# Patient Record
Sex: Male | Born: 1985
Health system: Southern US, Community
[De-identification: ages and names within clinical notes are randomized; demographics above are authoritative.]

## PROBLEM LIST (undated history)

## (undated) DIAGNOSIS — J45909 Unspecified asthma, uncomplicated: Secondary | ICD-10-CM

## (undated) DIAGNOSIS — K219 Gastro-esophageal reflux disease without esophagitis: Secondary | ICD-10-CM

## (undated) HISTORY — PX: NO PAST SURGERIES: SHX2092

---

## 2009-01-23 ENCOUNTER — Emergency Department: Payer: Self-pay | Admitting: Emergency Medicine

## 2009-08-24 ENCOUNTER — Emergency Department: Payer: Self-pay | Admitting: Emergency Medicine

## 2016-04-05 ENCOUNTER — Ambulatory Visit
Admission: EM | Admit: 2016-04-05 | Discharge: 2016-04-05 | Disposition: A | Discharge status: Home / Self Care | Payer: Self-pay | Attending: Family Medicine | Admitting: Family Medicine

## 2016-04-05 ENCOUNTER — Encounter: Payer: Self-pay | Admitting: Emergency Medicine

## 2016-04-05 DIAGNOSIS — J069 Acute upper respiratory infection, unspecified: Secondary | ICD-10-CM

## 2016-04-05 DIAGNOSIS — J209 Acute bronchitis, unspecified: Secondary | ICD-10-CM

## 2016-04-05 HISTORY — DX: Unspecified asthma, uncomplicated: J45.909

## 2016-04-05 LAB — RAPID INFLUENZA A&B ANTIGENS: Influenza B (ARMC): NEGATIVE

## 2016-04-05 LAB — RAPID STREP SCREEN (MED CTR MEBANE ONLY): STREPTOCOCCUS, GROUP A SCREEN (DIRECT): NEGATIVE

## 2016-04-05 LAB — RAPID INFLUENZA A&B ANTIGENS (ARMC ONLY): INFLUENZA A (ARMC): NEGATIVE

## 2016-04-05 MED ORDER — ACETAMINOPHEN 500 MG PO TABS
1000.0000 mg | ORAL_TABLET | Freq: Once | ORAL | Status: AC
  Administered 2016-04-05: 1000 mg via ORAL

## 2016-04-05 MED ORDER — IPRATROPIUM-ALBUTEROL 0.5-2.5 (3) MG/3ML IN SOLN
3.0000 mL | Freq: Once | RESPIRATORY_TRACT | Status: AC
  Administered 2016-04-05: 3 mL via RESPIRATORY_TRACT

## 2016-04-05 MED ORDER — ALBUTEROL SULFATE HFA 108 (90 BASE) MCG/ACT IN AERS: 2.0000 | INHALATION_SPRAY | RESPIRATORY_TRACT | 0 refills | Status: DC | PRN

## 2016-04-05 MED ORDER — METHYLPREDNISOLONE SODIUM SUCC 125 MG IJ SOLR
125.0000 mg | Freq: Once | INTRAMUSCULAR | Status: AC
  Administered 2016-04-05: 125 mg via INTRAMUSCULAR

## 2016-04-05 MED ORDER — PREDNISONE 10 MG (21) PO TBPK: ORAL_TABLET | ORAL | 0 refills | Status: DC

## 2016-04-05 MED ORDER — AZITHROMYCIN 250 MG PO TABS: ORAL_TABLET | ORAL | 0 refills | Status: DC

## 2016-04-05 NOTE — ED Provider Notes (Addendum)
[facility]    CSN: [id number] Arrival date & time: 04/05/16  1618  First Provider Contact:  First MD Initiated Contact with Patient 04/05/16 1838        History   Chief Complaint Chief Complaint  Patient presents with  . Cough    HPI Richard Greer is a 30 y.o. male.   Patient reports he works at the [employer] and yesterday before he left he started getting short of breath. 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 He has proximal spasm weakness general malaise and not feeling well either. He gets almost yearly allergies but nothing this bad of this os severe before the past. He does have a history of asthma and he has an albuterol inhaler at home this has expired. Nose is nebulizer machine at home as well. He states that he doesn't have any drug allergies habits unfortunately they proceeded want any stop baking [place] today because of shortness of breath. No pertinent for previous surgical history no pertinent family medical history relevant to today's visit   The history is provided by the patient. No language interpreter was used.  Cough  Cough characteristics:  Non-productive Severity:  Severe Onset quality:  Sudden Duration:  2 days Timing:  Constant Progression:  Worsening Chronicity:  New Smoker: yes   Context: exposure to allergens, smoke exposure and upper respiratory infection   Context: not animal exposure, not fumes, not occupational exposure, not sick contacts, not weather changes and not with activity   Relieved by:  Nothing Worsened by:  Nothing Ineffective treatments:  None tried Associated symptoms: diaphoresis, headaches, myalgias, rhinorrhea, shortness of breath, sinus congestion, sore throat and wheezing   Associated symptoms: no chest pain, no chills, no ear fullness, no ear pain, no eye discharge, no fever and no rash     Past Medical History:  Diagnosis Date  . Asthma     There are no active  problems to display for this patient.   History reviewed. No pertinent surgical history.     Home Medications    Prior to Admission medications   Medication Sig Start Date End Date Taking? Authorizing Provider  albuterol (PROVENTIL HFA;VENTOLIN HFA) 108 (90 Base) MCG/ACT inhaler Inhale 2 puffs into the lungs every 4 (four) hours as needed for wheezing or shortness of breath.   Yes Historical Provider, MD  albuterol (PROVENTIL HFA;VENTOLIN HFA) 108 (90 Base) MCG/ACT inhaler Inhale 2 puffs into the lungs every 4 (four) hours as needed for wheezing or shortness of breath. 04/05/16   Hassan Rowan, MD  predniSONE (STERAPRED UNI-PAK 21 TAB) 10 MG (21) TBPK tablet Sig 6 tablet day 1, 5 tablets day 2, 4 tablets day 3,,3tablets day 4, 2 tablets day 5, 1 tablet day 6 take all tablets orally 04/05/16   Hassan Rowan, MD    Family History History reviewed. No pertinent family history.  Social History Social History  Substance Use Topics  . Smoking status: Current Every Day Smoker  . Smokeless tobacco: Never Used  . Alcohol use Yes     Allergies   Review of patient's allergies indicates no known allergies.   Review of Systems Review of Systems  Constitutional: Positive for diaphoresis. Negative for chills and fever.  HENT: Positive for rhinorrhea and sore throat. Negative for ear pain.   Eyes: Negative for discharge.  Respiratory: Positive for cough, shortness of breath and wheezing.   Cardiovascular: Negative for chest pain.  Musculoskeletal: Positive for  myalgias.  Skin: Negative for rash.  Neurological: Positive for headaches.     Physical Exam Triage Vital Signs ED Triage Vitals  Enc Vitals Group     BP 04/05/16 1819 128/68     Pulse Rate 04/05/16 1819 97     Resp 04/05/16 1819 17     Temp 04/05/16 1819 (!) 101 F (38.3 C)     Temp Source 04/05/16 1819 Tympanic     SpO2 04/05/16 1819 99 %     Weight 04/05/16 1819 265 lb (120.2 kg)     Height 04/05/16 1819 5\' 10"  (1.778 m)       Head Circumference --      Peak Flow --      Pain Score 04/05/16 1822 5     Pain Loc --      Pain Edu? --      Excl. in GC? --    No data found.   Updated Vital Signs BP 128/68 (BP Location: Right Arm)   Pulse 97   Temp (!) 101 F (38.3 C) (Tympanic)   Resp 17   Ht 5\' 10"  (1.778 m)   Wt 265 lb (120.2 kg)   SpO2 99%   BMI 38.02 kg/m   Visual Acuity Right Eye Distance:   Left Eye Distance:   Bilateral Distance:    Right Eye Near:   Left Eye Near:    Bilateral Near:     Physical Exam  Constitutional: He is oriented to person, place, and time. He appears well-developed and well-nourished.  HENT:  Head: Normocephalic and atraumatic.  Right Ear: Hearing, tympanic membrane, external ear and ear canal normal.  Left Ear: Hearing, tympanic membrane, external ear and ear canal normal.  Nose: Mucosal edema present. Right sinus exhibits no maxillary sinus tenderness. Left sinus exhibits no maxillary sinus tenderness.  Mouth/Throat: Posterior oropharyngeal erythema present.  Eyes: Conjunctivae and EOM are normal. Pupils are equal, round, and reactive to light.  Neck: Normal range of motion. Neck supple.  Cardiovascular: Normal rate and regular rhythm.   Pulmonary/Chest: He has decreased breath sounds. He has wheezes.  Musculoskeletal: Normal range of motion.  Neurological: He is alert and oriented to person, place, and time.  Skin: Skin is warm.  Psychiatric: He has a normal mood and affect.  Vitals reviewed.    UC Treatments / Results  Labs (all labs ordered are listed, but only abnormal results are displayed) Labs Reviewed  RAPID STREP SCREEN (NOT AT [facility])  RAPID INFLUENZA A&B ANTIGENS ([facility] ONLY)  CULTURE, GROUP A STREP [facility])    EKG  EKG Interpretation None       Radiology No results found.  Procedures Procedures (including critical care time)  Medications Ordered in UC Medications  ipratropium-albuterol (DUONEB) 0.5-2.5 (3) MG/3ML nebulizer  solution 3 mL (not administered)  methylPREDNISolone sodium succinate (SOLU-MEDROL) 125 mg/2 mL injection 125 mg (not administered)  acetaminophen (TYLENOL) tablet 1,000 mg (1,000 mg Oral Given 04/05/16 1827)     Initial Impression / Assessment and Plan / UC Course  I have reviewed the triage vital signs and the nursing notes.  Pertinent labs & imaging results that were available during my care of the patient were reviewed by me and considered in my medical decision making (see chart for details). Results for orders placed or performed during the hospital encounter of 04/05/16  Rapid strep screen  Result Value Ref Range   Streptococcus, Group A Screen (Direct) NEGATIVE NEGATIVE  Rapid Influenza A&B Antigens ([facility] only)  Result Value Ref Range   Influenza A ([facility]) NEGATIVE NEGATIVE   Influenza B ([facility]) NEGATIVE NEGATIVE   Clinical Course   Patient will be given DuoNeb treatment and Solu-Medrol 125 mg IM We'll place him on oral prednisone and work note for today and tomorrow we His albuterol inhaler Patient's appears to be more allergy related than anything else. Strep test flu test were negative and if the strep culture comes back positive we'll place on antibiotics  Final Clinical Impressions(s) / UC Diagnoses   Final diagnoses:  URI (upper respiratory infection)  Acute bronchitis with bronchospasm    New Prescriptions New Prescriptions   ALBUTEROL (PROVENTIL HFA;VENTOLIN HFA) 108 (90 BASE) MCG/ACT INHALER    Inhale 2 puffs into the lungs every 4 (four) hours as needed for wheezing or shortness of breath.   PREDNISONE (STERAPRED UNI-PAK 21 TAB) 10 MG (21) TBPK TABLET    Sig 6 tablet day 1, 5 tablets day 2, 4 tablets day 3,,3tablets day 4, 2 tablets day 5, 1 tablet day 6 take all tablets orally    In reviewing patient's chart his temperature greater 101 with that in mind going to go ahead and place on Z-Pak without having a positive strep culture.   Hassan Rowan, MD 04/05/16  Serena Croissant    Hassan Rowan, MD 04/05/16 [phone]

## 2016-04-05 NOTE — ED Triage Notes (Signed)
Patient c/o sore throat, cough, chest congestion, SOB, and runny nose that started yesterday.

## 2016-04-08 LAB — CULTURE, GROUP A STREP (THRC)

## 2016-04-11 ENCOUNTER — Telehealth: Payer: Self-pay | Admitting: *Deleted

## 2016-04-11 NOTE — Telephone Encounter (Signed)
Patient returned phone call. Informed patient that his strep culture resulted negative. Patient confirmed understanding of test results.

## 2016-09-08 ENCOUNTER — Emergency Department: Payer: BLUE CROSS/BLUE SHIELD

## 2016-09-08 ENCOUNTER — Emergency Department
Admission: EM | Admit: 2016-09-08 | Discharge: 2016-09-08 | Disposition: A | Discharge status: Home / Self Care | Payer: BLUE CROSS/BLUE SHIELD | Attending: Emergency Medicine | Admitting: Emergency Medicine

## 2016-09-08 ENCOUNTER — Encounter: Payer: Self-pay | Admitting: Emergency Medicine

## 2016-09-08 DIAGNOSIS — Z23 Encounter for immunization: Secondary | ICD-10-CM | POA: Insufficient documentation

## 2016-09-08 DIAGNOSIS — J45909 Unspecified asthma, uncomplicated: Secondary | ICD-10-CM | POA: Insufficient documentation

## 2016-09-08 DIAGNOSIS — Y929 Unspecified place or not applicable: Secondary | ICD-10-CM | POA: Insufficient documentation

## 2016-09-08 DIAGNOSIS — Y9389 Activity, other specified: Secondary | ICD-10-CM | POA: Insufficient documentation

## 2016-09-08 DIAGNOSIS — Y999 Unspecified external cause status: Secondary | ICD-10-CM | POA: Insufficient documentation

## 2016-09-08 DIAGNOSIS — W540XXA Bitten by dog, initial encounter: Secondary | ICD-10-CM | POA: Diagnosis not present

## 2016-09-08 DIAGNOSIS — Z79899 Other long term (current) drug therapy: Secondary | ICD-10-CM | POA: Diagnosis not present

## 2016-09-08 DIAGNOSIS — S61431A Puncture wound without foreign body of right hand, initial encounter: Secondary | ICD-10-CM

## 2016-09-08 DIAGNOSIS — S61412A Laceration without foreign body of left hand, initial encounter: Secondary | ICD-10-CM

## 2016-09-08 DIAGNOSIS — F17200 Nicotine dependence, unspecified, uncomplicated: Secondary | ICD-10-CM | POA: Insufficient documentation

## 2016-09-08 DIAGNOSIS — S61459A Open bite of unspecified hand, initial encounter: Secondary | ICD-10-CM

## 2016-09-08 DIAGNOSIS — S61411A Laceration without foreign body of right hand, initial encounter: Secondary | ICD-10-CM | POA: Diagnosis present

## 2016-09-08 MED ORDER — AMOXICILLIN-POT CLAVULANATE 875-125 MG PO TABS: 1.0000 | ORAL_TABLET | Freq: Two times a day (BID) | ORAL | 0 refills | Status: AC

## 2016-09-08 MED ORDER — AMOXICILLIN-POT CLAVULANATE 875-125 MG PO TABS
1.0000 | ORAL_TABLET | Freq: Once | ORAL | Status: AC
  Administered 2016-09-08: 1 via ORAL
  Filled 2016-09-08: qty 1

## 2016-09-08 MED ORDER — TETANUS-DIPHTH-ACELL PERTUSSIS 5-2.5-18.5 LF-MCG/0.5 IM SUSP
0.5000 mL | Freq: Once | INTRAMUSCULAR | Status: AC
  Administered 2016-09-08: 0.5 mL via INTRAMUSCULAR
  Filled 2016-09-08: qty 0.5

## 2016-09-08 NOTE — ED Notes (Signed)
Pt discharged to home.  Discharge instructions reviewed.  Verbalized understanding.  No questions or concerns at this time.  Teach back verified.  Pt in NAD.  No items left in ED.   

## 2016-09-08 NOTE — Discharge Instructions (Signed)
As we discussed, your left palm laceration needs to stay open and heal because of it being caused by a dog bite.  Because you are having trouble extending your right index finger due to a laceration or puncture wound from glass, we strongly encourage you to call the office of Dr. Mathis BudHernandez-Soria as soon as the clinic opens this morning and see if Dr. Stephenie AcresSoria can see you today, or to schedule the next available appointment.    Return to the emergency department if you develop new or worsening symptoms that concern you.

## 2016-09-08 NOTE — ED Provider Notes (Signed)
[facility] Emergency Department Provider Note  ____________________________________________   First MD Initiated Contact with Patient 09/08/16 [phone]     (approximate)  I have reviewed the triage vital signs and the nursing notes.   HISTORY  Chief Complaint Hand Injury    HPI Richard Greer is a 31 y.o. male with no significant chronic medical issues who presents for evaluation of acute injuries to both of his hands.  He reports that he was playing with his dog outside and she caught him in the left palm with one of her teeth and caused a long laceration to the palm of his hand.  He was going back and side and somehow pushed on the glass of the door which broke and punctured him in the back of his right hand in 2 separate locations.  He has some mild to moderate pain in the right index finger and is having difficulty with full extension and pain with flexion.  There is some swelling at the site of the puncture.  Movement makes the symptoms worse and holding still makes it better.  The injuries occurred just prior to his arrival in the emergency department.  He does not know the date of his last tetanus vaccination.  He does state that all of his dog's vaccinations are up-to-date.   Past Medical History:  Diagnosis Date  . Asthma     There are no active problems to display for this patient.   History reviewed. No pertinent surgical history.  Prior to Admission medications   Medication Sig Start Date End Date Taking? Authorizing Provider  albuterol (PROVENTIL HFA;VENTOLIN HFA) 108 (90 Base) MCG/ACT inhaler Inhale 2 puffs into the lungs every 4 (four) hours as needed for wheezing or shortness of breath.    Historical Provider, MD  albuterol (PROVENTIL HFA;VENTOLIN HFA) 108 (90 Base) MCG/ACT inhaler Inhale 2 puffs into the lungs every 4 (four) hours as needed for wheezing or shortness of breath. 04/05/16   Hassan Rowan, MD  amoxicillin-clavulanate (AUGMENTIN)  875-125 MG tablet Take 1 tablet by mouth every 12 (twelve) hours. 09/08/16 09/18/16  Loleta Rose, MD  azithromycin (ZITHROMAX Z-PAK) 250 MG tablet Take 2 tablets first day and then 1 po a day for 4 days 04/05/16   Hassan Rowan, MD  predniSONE (STERAPRED UNI-PAK 21 TAB) 10 MG (21) TBPK tablet Sig 6 tablet day 1, 5 tablets day 2, 4 tablets day 3,,3tablets day 4, 2 tablets day 5, 1 tablet day 6 take all tablets orally 04/05/16   Hassan Rowan, MD    Allergies Patient has no known allergies.  No family history on file.  Social History Social History  Substance Use Topics  . Smoking status: Current Every Day Smoker  . Smokeless tobacco: Never Used  . Alcohol use Yes    Review of Systems Constitutional: No fever/chills Eyes: No visual changes. ENT: No sore throat. Cardiovascular: Denies chest pain. Respiratory: Denies shortness of breath. Gastrointestinal: No abdominal pain.  No nausea, no vomiting.  No diarrhea.  No constipation. Genitourinary: Negative for dysuria. Musculoskeletal: Laceration due to dog bite on the palm of his left hand.  Puncture wounds x 2 on the back of his right hand due to glass. Skin: Negative for rash. Neurological: Negative for headaches, focal weakness or numbness.  10-point ROS otherwise negative.  ____________________________________________   PHYSICAL EXAM:  VITAL SIGNS: ED Triage Vitals  Enc Vitals Group     BP 09/08/16 0100 (!) 155/96     Pulse  Rate 09/08/16 0100 (!) 105     Resp 09/08/16 0100 18     Temp 09/08/16 0100 98.1 F (36.7 C)     Temp Source 09/08/16 0100 Oral     SpO2 09/08/16 0100 96 %     Weight 09/08/16 0100 265 lb (120.2 kg)     Height 09/08/16 0100 5\' 10"  (1.778 m)     Head Circumference --      Peak Flow --      Pain Score 09/08/16 0103 5     Pain Loc --      Pain Edu? --      Excl. in GC? --     Constitutional: Alert and oriented. Well appearing and in no acute distress. Eyes: Conjunctivae are normal. PERRL. EOMI. Head:  Atraumatic. Nose: No congestion/rhinnorhea. Mouth/Throat: Mucous membranes are moist. Neck: No stridor.  No meningeal signs.   Cardiovascular: Normal rate, regular rhythm. Good peripheral circulation. Grossly normal heart sounds. Respiratory: Normal respiratory effort.  No retractions. Lungs CTAB. Gastrointestinal: Soft and nontender. No distention.  Musculoskeletal: The patient has a relatively superficial wound that is about 5 cm long and goes across the palm of his left hand.  The 2 ends of the wound are slightly deeper but they are not gaping.  There is no evidence of any foreign body and there is only minimal tenderness to palpation.  The patient had already irrigated the wound and I made him irrigate his hand again extensively with soap and water in the sink.  This wound was caused by the dog's teeth.  On his right hand he has what appears to be a relatively superficial puncture wound on the dorsal aspect of the hand several centimeters proximal to the index finger MCP.  There is a significant amount of swelling to the area and tenderness to palpation.  There is a more superficial secondary wound proximal to the middle finger MCP.  The patient has sensation to light touch although he does state that the index finger "tingles".  He is able to flex normally but has some trouble with full extension of the finger. Neurologic:  Normal speech and language. No gross focal neurologic deficits are appreciated.    ____________________________________________   LABS (all labs ordered are listed, but only abnormal results are displayed)  Labs Reviewed - No data to display ____________________________________________  EKG  None - EKG not ordered by ED physician ____________________________________________  RADIOLOGY   Dg Hand 2 View Right  Result Date: 09/08/2016 CLINICAL DATA:  31 year old male with right hand injury and pain. EXAM: RIGHT HAND - 2 VIEW COMPARISON:  None. FINDINGS: There is no  acute fracture or dislocation. The bones are well mineralized. No arthritic changes. The soft tissues appear unremarkable. No radiopaque foreign object noted. IMPRESSION: Negative. Electronically Signed   By: Elgie CollardArash  Radparvar M.D.   On: 09/08/2016 01:26    ____________________________________________   PROCEDURES  Procedure(s) performed:   Marland Kitchen.Marland Kitchen.Laceration Repair Date/Time: 09/08/2016 3:24 AM Performed by: Loleta RoseFORBACH, Arvid Marengo Authorized by: Loleta RoseFORBACH, Carlos Quackenbush   Consent:    Consent obtained:  Verbal   Consent given by:  Patient Anesthesia (see MAR for exact dosages):    Anesthesia method:  None Laceration details:    Location:  Hand   Hand location:  R hand, dorsum   Length (cm):  1 Repair type:    Repair type:  Simple Exploration:    Contaminated: no   Treatment:    Amount of cleaning:  Extensive   Irrigation solution:  Tap water   Visualized foreign bodies/material removed: no   Skin repair:    Repair method:  Tissue adhesive Post-procedure details:    Dressing:  Open (no dressing)   Patient tolerance of procedure:  Tolerated well, no immediate complications     Critical Care performed: No ____________________________________________   INITIAL IMPRESSION / ASSESSMENT AND PLAN / ED COURSE  Pertinent labs & imaging results that were available during my care of the patient were reviewed by me and considered in my medical decision making (see chart for details).  I am concerned about the possibility of an extensor injury; though the injury appears relatively superficial, I think it is possible that he may have sustained a puncture injury that caused ligamentous/tendon injury.  I placed him in an aluminum foam finger splint and wrapped his hand with an Ace wrap to provide gentle pressure to the hematoma.  I encouraged him to follow up in a few hours when the Ortho clinic opens to follow up ASAP with Dr. Stephenie Acres (hand specialist).  Leaving laceration on left palm (dog bite) to heal by  secondary intention.  Prescribed Augmentin.  Gave usual/customary return precautions.       ____________________________________________  FINAL CLINICAL IMPRESSION(S) / ED DIAGNOSES  Final diagnoses:  Laceration of skin of left palm, initial encounter  Puncture wound of right hand without foreign body, initial encounter  Dog bite of hand without complication, initial encounter     MEDICATIONS GIVEN DURING THIS VISIT:  Medications  Tdap (BOOSTRIX) injection 0.5 mL (0.5 mLs Intramuscular Given 09/08/16 0311)  amoxicillin-clavulanate (AUGMENTIN) 875-125 MG per tablet 1 tablet (1 tablet Oral Given 09/08/16 0311)     NEW OUTPATIENT MEDICATIONS STARTED DURING THIS VISIT:  New Prescriptions   AMOXICILLIN-CLAVULANATE (AUGMENTIN) 875-125 MG TABLET    Take 1 tablet by mouth every 12 (twelve) hours.    Modified Medications   No medications on file    Discontinued Medications   No medications on file     Note:  This document was prepared using Dragon voice recognition software and may include unintentional dictation errors.    Loleta Rose, MD 09/08/16 [phone]

## 2016-09-08 NOTE — ED Triage Notes (Signed)
Pt unable to move right 2nd finger

## 2016-09-08 NOTE — ED Triage Notes (Addendum)
Patient ambulatory to triage with steady gait, without difficulty or distress noted; pt reports hitting glass door while playing with dog; lac to palm of left hand which is from the dog and 2 punctures to top of right hand from glass; weakness to index finger but good sensation; no active bleeding; sites clensed with NS and gauze dressing applied

## 2016-09-08 NOTE — ED Notes (Signed)
L hand lac approx 4 cm long on palm of hand.

## 2016-09-14 ENCOUNTER — Encounter
Admission: RE | Admit: 2016-09-14 | Discharge: 2016-09-14 | Disposition: A | Discharge status: Home / Self Care | Payer: BLUE CROSS/BLUE SHIELD | Source: Ambulatory Visit | Attending: Orthopedic Surgery | Admitting: Orthopedic Surgery

## 2016-09-14 HISTORY — DX: Gastro-esophageal reflux disease without esophagitis: K21.9

## 2016-09-14 NOTE — Patient Instructions (Signed)
  Your procedure is scheduled on: 09-15-16 Report to Same Day Surgery 2nd floor medical mall [facility](Medical Mall Entrance-take elevator on left to 2nd floor.  Check in with surgery information desk.) To find out your arrival time please call [phone] between 1PM - 3PM on 09-14-16  Remember: Instructions that are not followed completely may result in serious medical risk, up to and including death, or upon the discretion of your surgeon and anesthesiologist your surgery may need to be rescheduled.    _x___ 1. Do not eat food or drink liquids after midnight. No gum chewing or hard candies.     __x__ 2. No Alcohol for 24 hours before or after surgery.   __x__3. No Smoking for 24 prior to surgery.   ____  4. Bring all medications with you on the day of surgery if instructed.    __x__ 5. Notify your doctor if there is any change in your medical condition     (cold, fever, infections).     Do not wear jewelry, make-up, hairpins, clips or nail polish.  Do not wear lotions, powders, or perfumes. You may wear deodorant.  Do not shave 48 hours prior to surgery. Men may shave face and neck.  Do not bring valuables to the hospital.    [facility] any belongings or valuables.               Contacts, dentures or bridgework may not be worn into surgery.  Leave your suitcase in the car. After surgery it may be brought to your room.  For patients admitted to the hospital, discharge time is determined by your treatment team.   Patients discharged the day of surgery will not be allowed to drive home.  You will need someone to drive you home and stay with you the night of your procedure.    Please read over the following fact sheets that you were given:   [facility] Preparing for Surgery and or MRSA Information   ____ Take these medicines the morning of surgery with A SIP OF WATER:    1. NONE  2.  3.  4.  5.  6.  ____Fleets enema or Magnesium Citrate as directed.   ____  Use CHG Soap or sage wipes as directed on instruction sheet   _X___ Use inhalers on the day of surgery and bring to hospital day of surgery-USE ALBUTEROL INHALER AT HOME AND BRING TO HOSPITAL  ____ Stop metformin 2 days prior to surgery    ____ Take 1/2 of usual insulin dose the night before surgery and none on the morning of           surgery.   ____ Stop Aspirin, Coumadin, Pllavix ,Eliquis, Effient, or Pradaxa  x__ Stop Anti-inflammatories such as Advil, Aleve, Ibuprofen, Motrin, Naproxen,          Naprosyn, Goodies powders or aspirin products. Ok to take Tylenol.   ____ Stop supplements until after surgery.    ____ Bring C-Pap to the hospital.

## 2016-09-15 ENCOUNTER — Ambulatory Visit
Admission: RE | Admit: 2016-09-15 | Discharge: 2016-09-15 | Disposition: A | Discharge status: Home / Self Care | Payer: BLUE CROSS/BLUE SHIELD | Source: Ambulatory Visit | Attending: Orthopedic Surgery | Admitting: Orthopedic Surgery

## 2016-09-15 ENCOUNTER — Ambulatory Visit: Payer: BLUE CROSS/BLUE SHIELD | Admitting: Anesthesiology

## 2016-09-15 ENCOUNTER — Encounter
Admission: RE | Disposition: A | Discharge status: Home / Self Care | Payer: Self-pay | Source: Ambulatory Visit | Attending: Orthopedic Surgery

## 2016-09-15 ENCOUNTER — Encounter: Payer: Self-pay | Admitting: *Deleted

## 2016-09-15 DIAGNOSIS — S66300A Unspecified injury of extensor muscle, fascia and tendon of right index finger at wrist and hand level, initial encounter: Secondary | ICD-10-CM | POA: Insufficient documentation

## 2016-09-15 DIAGNOSIS — Z6841 Body Mass Index (BMI) 40.0 and over, adult: Secondary | ICD-10-CM | POA: Insufficient documentation

## 2016-09-15 DIAGNOSIS — W25XXXA Contact with sharp glass, initial encounter: Secondary | ICD-10-CM | POA: Diagnosis not present

## 2016-09-15 DIAGNOSIS — Z87891 Personal history of nicotine dependence: Secondary | ICD-10-CM | POA: Diagnosis not present

## 2016-09-15 DIAGNOSIS — J45909 Unspecified asthma, uncomplicated: Secondary | ICD-10-CM | POA: Diagnosis not present

## 2016-09-15 HISTORY — PX: REPAIR EXTENSOR TENDON: SHX5382

## 2016-09-15 SURGERY — REPAIR, TENDON, EXTENSOR
Anesthesia: General | Laterality: Right | Wound class: Clean

## 2016-09-15 MED ORDER — ACETAMINOPHEN 10 MG/ML IV SOLN
INTRAVENOUS | Status: DC | PRN
  Administered 2016-09-15: 1000 mg via INTRAVENOUS

## 2016-09-15 MED ORDER — METHYLENE BLUE 0.5 % INJ SOLN
INTRAVENOUS | Status: AC
  Filled 2016-09-15: qty 10

## 2016-09-15 MED ORDER — FAMOTIDINE 20 MG PO TABS
ORAL_TABLET | ORAL | Status: AC
  Filled 2016-09-15: qty 1

## 2016-09-15 MED ORDER — ONDANSETRON HCL 4 MG/2ML IJ SOLN
INTRAMUSCULAR | Status: DC | PRN
  Administered 2016-09-15: 4 mg via INTRAVENOUS

## 2016-09-15 MED ORDER — CEFAZOLIN SODIUM 10 G IJ SOLR
3.0000 g | Freq: Once | INTRAMUSCULAR | Status: AC
  Administered 2016-09-15: 13:00:00 via INTRAVENOUS
  Administered 2016-09-15: 3 g via INTRAVENOUS
  Filled 2016-09-15: qty 3000

## 2016-09-15 MED ORDER — KETOROLAC TROMETHAMINE 30 MG/ML IJ SOLN
INTRAMUSCULAR | Status: AC
  Filled 2016-09-15: qty 1

## 2016-09-15 MED ORDER — GLYCOPYRROLATE 0.2 MG/ML IJ SOLN
INTRAMUSCULAR | Status: AC
  Filled 2016-09-15: qty 1

## 2016-09-15 MED ORDER — NEOMYCIN-POLYMYXIN B GU 40-200000 IR SOLN
Status: AC
  Filled 2016-09-15: qty 2

## 2016-09-15 MED ORDER — ACETAMINOPHEN 10 MG/ML IV SOLN
INTRAVENOUS | Status: AC
  Filled 2016-09-15: qty 100

## 2016-09-15 MED ORDER — MIDAZOLAM HCL 2 MG/2ML IJ SOLN
INTRAMUSCULAR | Status: DC | PRN
  Administered 2016-09-15: 2 mg via INTRAVENOUS

## 2016-09-15 MED ORDER — SEVOFLURANE IN SOLN
RESPIRATORY_TRACT | Status: AC
  Filled 2016-09-15: qty 250

## 2016-09-15 MED ORDER — HYDROCODONE-ACETAMINOPHEN 5-325 MG PO TABS: 1.0000 | ORAL_TABLET | ORAL | 0 refills | Status: DC | PRN

## 2016-09-15 MED ORDER — MIDAZOLAM HCL 2 MG/2ML IJ SOLN
INTRAMUSCULAR | Status: AC
  Filled 2016-09-15: qty 2

## 2016-09-15 MED ORDER — DEXAMETHASONE SODIUM PHOSPHATE 10 MG/ML IJ SOLN
INTRAMUSCULAR | Status: DC | PRN
  Administered 2016-09-15: 10 mg via INTRAVENOUS

## 2016-09-15 MED ORDER — FENTANYL CITRATE (PF) 100 MCG/2ML IJ SOLN: 25.0000 ug | INTRAMUSCULAR | Status: DC | PRN

## 2016-09-15 MED ORDER — BUPIVACAINE HCL (PF) 0.5 % IJ SOLN
INTRAMUSCULAR | Status: AC
  Filled 2016-09-15: qty 30

## 2016-09-15 MED ORDER — LACTATED RINGERS IV SOLN
INTRAVENOUS | Status: DC
  Administered 2016-09-15 (×2): via INTRAVENOUS

## 2016-09-15 MED ORDER — GLYCOPYRROLATE 0.2 MG/ML IJ SOLN
INTRAMUSCULAR | Status: DC | PRN
  Administered 2016-09-15: 0.2 mg via INTRAVENOUS

## 2016-09-15 MED ORDER — FENTANYL CITRATE (PF) 100 MCG/2ML IJ SOLN
INTRAMUSCULAR | Status: DC | PRN
  Administered 2016-09-15: 100 ug via INTRAVENOUS

## 2016-09-15 MED ORDER — PROPOFOL 10 MG/ML IV BOLUS
INTRAVENOUS | Status: DC | PRN
  Administered 2016-09-15: 200 mg via INTRAVENOUS

## 2016-09-15 MED ORDER — PROPOFOL 10 MG/ML IV BOLUS
INTRAVENOUS | Status: AC
  Filled 2016-09-15: qty 20

## 2016-09-15 MED ORDER — BUPIVACAINE HCL (PF) 0.5 % IJ SOLN
INTRAMUSCULAR | Status: DC | PRN
  Administered 2016-09-15: 10 mL

## 2016-09-15 MED ORDER — ONDANSETRON HCL 4 MG/2ML IJ SOLN: 4.0000 mg | Freq: Once | INTRAMUSCULAR | Status: DC | PRN

## 2016-09-15 MED ORDER — LIDOCAINE HCL (CARDIAC) 20 MG/ML IV SOLN
INTRAVENOUS | Status: DC | PRN
  Administered 2016-09-15: 100 mg via INTRAVENOUS

## 2016-09-15 MED ORDER — FAMOTIDINE 20 MG PO TABS
20.0000 mg | ORAL_TABLET | Freq: Once | ORAL | Status: AC
  Administered 2016-09-15: 20 mg via ORAL

## 2016-09-15 MED ORDER — FENTANYL CITRATE (PF) 100 MCG/2ML IJ SOLN
INTRAMUSCULAR | Status: AC
  Filled 2016-09-15: qty 2

## 2016-09-15 SURGICAL SUPPLY — 38 items
BANDAGE ELASTIC 4 CLIP NS LF (GAUZE/BANDAGES/DRESSINGS) ×3 IMPLANT
BNDG ESMARK 4X12 TAN STRL LF (GAUZE/BANDAGES/DRESSINGS) ×3 IMPLANT
CANISTER SUCT 1200ML W/VALVE (MISCELLANEOUS) ×3 IMPLANT
CHLORAPREP W/TINT 26ML (MISCELLANEOUS) ×3 IMPLANT
CORD BIP STRL DISP 12FT (MISCELLANEOUS) ×3 IMPLANT
CUFF TOURN SGL QUICK 18 (TOURNIQUET CUFF) IMPLANT
CUFF TOURN SGL QUICK 24 (TOURNIQUET CUFF)
CUFF TRNQT CYL 24X4X40X1 (TOURNIQUET CUFF) IMPLANT
ELECT CAUTERY BLADE 6.4 (BLADE) ×3 IMPLANT
FORCEPS JEWEL BIP 4-3/4 STR (INSTRUMENTS) ×3 IMPLANT
GAUZE PETRO XEROFOAM 1X8 (MISCELLANEOUS) ×3 IMPLANT
GAUZE SPONGE 4X4 12PLY STRL (GAUZE/BANDAGES/DRESSINGS) ×3 IMPLANT
GLOVE BIOGEL PI IND STRL 9 (GLOVE) ×1 IMPLANT
GLOVE BIOGEL PI INDICATOR 9 (GLOVE) ×2
GLOVE SURG SYN 9.0  PF PI (GLOVE) ×2
GLOVE SURG SYN 9.0 PF PI (GLOVE) ×1 IMPLANT
GOWN SRG 2XL LVL 4 RGLN SLV (GOWNS) ×1 IMPLANT
GOWN STRL NON-REIN 2XL LVL4 (GOWNS) ×2
GOWN STRL REUS W/ TWL LRG LVL3 (GOWN DISPOSABLE) ×1 IMPLANT
GOWN STRL REUS W/TWL LRG LVL3 (GOWN DISPOSABLE) ×2
KIT RM TURNOVER STRD PROC AR (KITS) ×3 IMPLANT
NDL SAFETY 25GX1.5 (NEEDLE) ×3 IMPLANT
NS IRRIG 500ML POUR BTL (IV SOLUTION) ×3 IMPLANT
PACK EXTREMITY ARMC (MISCELLANEOUS) ×3 IMPLANT
PAD CAST CTTN 4X4 STRL (SOFTGOODS) ×1 IMPLANT
PAD GROUND ADULT SPLIT (MISCELLANEOUS) ×3 IMPLANT
PADDING CAST COTTON 4X4 STRL (SOFTGOODS) ×2
SPLINT CAST 1 STEP 4X15 (MISCELLANEOUS) ×3 IMPLANT
STOCKINETTE STRL 4IN 9604848 (GAUZE/BANDAGES/DRESSINGS) ×3 IMPLANT
SUT ETHIBOND GREEN BRAID 0S 4 (SUTURE) ×3 IMPLANT
SUT ETHILON 4 0 P 3 18 (SUTURE) ×6 IMPLANT
SUT ETHILON 4-0 (SUTURE) ×2
SUT ETHILON 4-0 FS2 18XMFL BLK (SUTURE) ×1
SUT ETHILON 5 0 P 3 18 (SUTURE) ×2
SUT MERSILENE 4-0 WHT RB-1 (SUTURE) ×3 IMPLANT
SUT NYLON ETHILON 5-0 P-3 1X18 (SUTURE) ×1 IMPLANT
SUTURE ETHLN 4-0 FS2 18XMF BLK (SUTURE) ×1 IMPLANT
SYRINGE 10CC LL (SYRINGE) ×3 IMPLANT

## 2016-09-15 NOTE — Transfer of Care (Signed)
Immediate Anesthesia Transfer of Care Note  Patient: Richard ComasSamuel J Greer  Procedure(s) Performed: Procedure(s): REPAIR EXTENSOR TENDON RIGHT INDEX FINGER (Right)  Patient Location: PACU  Anesthesia Type:General  Level of Consciousness: sedated  Airway & Oxygen Therapy: Patient Spontanous Breathing and Patient connected to face mask oxygen  Post-op Assessment: Report given to RN and Post -op Vital signs reviewed and stable  Post vital signs: Reviewed and stable  Last Vitals:  Vitals:   09/15/16 1032  BP: (!) 176/98  Pulse: 66  Resp: 18  Temp: 36.8 C    Last Pain:  Vitals:   09/15/16 1032  TempSrc: Oral  PainSc: 2          Complications: No apparent anesthesia complications

## 2016-09-15 NOTE — Anesthesia Post-op Follow-up Note (Cosign Needed)
Anesthesia QCDR form completed.        

## 2016-09-15 NOTE — Discharge Instructions (Signed)
Keep splint clean and dry. Don't try to bend the index and middle finger. Pain medicine as directed. Region prior medications

## 2016-09-15 NOTE — Anesthesia Procedure Notes (Signed)
Procedure Name: LMA Insertion Date/Time: 09/15/2016 1:15 PM Performed by: Junious SilkNOLES, Shahab Polhamus Pre-anesthesia Checklist: Patient identified, Patient being monitored, Timeout performed, Emergency Drugs available and Suction available Patient Re-evaluated:Patient Re-evaluated prior to inductionOxygen Delivery Method: Circle system utilized Preoxygenation: Pre-oxygenation with 100% oxygen Intubation Type: IV induction Ventilation: Mask ventilation without difficulty LMA: LMA inserted LMA Size: 4.5 Tube type: Oral Number of attempts: 1 Placement Confirmation: positive ETCO2 and breath sounds checked- equal and bilateral Tube secured with: Tape Dental Injury: Teeth and Oropharynx as per pre-operative assessment

## 2016-09-15 NOTE — Op Note (Signed)
09/15/2016  1:53 PM  PATIENT:  Richard Greer  31 y.o. male   PRE-OPERATIVE DIAGNOSIS:  UNSPECIFIED INJURY OF EXTENSOR MUSCLE, FASCIA AND TENDON OF OTHER FINGERAT WRIST HAND LEVEL extensor tendon lacerations to right index finger  POST-OPERATIVE DIAGNOSIS:  UNSPECIFIED INJURY OF EXTENSOR MUSCLE, FASCIA AND TENDON OF OTHER FINGERAT WRIST HAND LEVE same  PROCEDURE:  Procedure(s): REPAIR EXTENSOR TENDON RIGHT INDEX FINGER (Right) EIP and index EDC  SURGEON: Leitha SchullerMichael J Kellis Topete, MD  ASSISTANTS: None  ANESTHESIA:   general  EBL:  Total I/O In: 700 [I.V.:700] Out: 1 [Blood:1]  BLOOD ADMINISTERED:none  DRAINS: none   LOCAL MEDICATIONS USED:  MARCAINE     SPECIMEN:  No Specimen  DISPOSITION OF SPECIMEN:  N/A  COUNTS:  YES  TOURNIQUET:    IMPLANTS: None  DICTATION: .Dragon Dictation patient brought the operating room and after adequate anesthesia was obtained the right arm prepped draped in sterile fashion. After patient identification timeout procedures were completed tourniquet was raised and oblique incision was made incorporating the prior laceration over the dorsum of the hand. After the subcutaneous tissues tissue spread the proximal ends of the tendons were exposed identified and pulled up into the wound going distally with the finger in hyperextension the proximal the proximal ends of the tendons were identified as well and pulled into the wound. Using 4-0 Ethibond sutures were placed through the distal stump and then the proximal stumps and essentially a modified Bennell-type suture and reinforced with second suture with these 2 sutures in place first the EIP and then the EDC to the index the finger appeared to be an appropriate position with slight extension compared to the other fingers. The wounds were irrigated and then the skin closed with simple 4-0 nylon with the finger held in extension during this portion of the procedure sterile dressings of Xeroform 4 x 4 web roll and Ace  wrap applied with Ace splint holding the wrist and fingers in extension  PLAN OF CARE: Discharge to home after PACU  PATIENT DISPOSITION:  PACU - hemodynamically stable.

## 2016-09-15 NOTE — Progress Notes (Signed)
Right hand elevated   Capillary refill positive to right hand   Warm and dry to touch

## 2016-09-15 NOTE — Anesthesia Postprocedure Evaluation (Signed)
Anesthesia Post Note  Patient: Richard ComasSamuel J Humphreys  Procedure(s) Performed: Procedure(s) (LRB): REPAIR EXTENSOR TENDON RIGHT INDEX FINGER (Right)  Patient location during evaluation: PACU Anesthesia Type: General Level of consciousness: awake and alert Pain management: pain level controlled Vital Signs Assessment: post-procedure vital signs reviewed and stable Respiratory status: spontaneous breathing, nonlabored ventilation, respiratory function stable and patient connected to nasal cannula oxygen Cardiovascular status: blood pressure returned to baseline and stable Postop Assessment: no signs of nausea or vomiting Anesthetic complications: no     Last Vitals:  Vitals:   09/15/16 1443 09/15/16 1513  BP: 140/86 137/67  Pulse: 66 64  Resp: 18   Temp: (!) 35.9 C     Last Pain:  Vitals:   09/15/16 1443  TempSrc: Temporal  PainSc:                  Nickalous Stingley S

## 2016-09-15 NOTE — Anesthesia Preprocedure Evaluation (Signed)
Anesthesia Evaluation  Patient identified by MRN, date of birth, ID band Patient awake    Reviewed: Allergy & Precautions, NPO status , Patient's Chart, lab work & pertinent test results, reviewed documented beta blocker date and time   Airway Mallampati: III  TM Distance: >3 FB     Dental  (+) Chipped   Pulmonary asthma , former smoker,           Cardiovascular      Neuro/Psych    GI/Hepatic   Endo/Other  Morbid obesity  Renal/GU      Musculoskeletal   Abdominal   Peds  Hematology   Anesthesia Other Findings   Reproductive/Obstetrics                             Anesthesia Physical Anesthesia Plan  ASA: III  Anesthesia Plan: General   Post-op Pain Management:    Induction: Intravenous  Airway Management Planned: LMA  Additional Equipment:   Intra-op Plan:   Post-operative Plan:   Informed Consent: I have reviewed the patients History and Physical, chart, labs and discussed the procedure including the risks, benefits and alternatives for the proposed anesthesia with the patient or authorized representative who has indicated his/her understanding and acceptance.     Plan Discussed with: CRNA  Anesthesia Plan Comments:         Anesthesia Quick Evaluation

## 2016-09-15 NOTE — H&P (Signed)
Reviewed paper H+P, will be scanned into chart.Patient exmained. No changes noted.

## 2017-10-14 IMAGING — CR DG HAND 2V*R*
1 series · 2 of 2 positions shown · non-contrast
Comparison: None.

CLINICAL DATA: 30-year-old male with right hand injury and pain.

EXAM:
RIGHT HAND - 2 VIEW

[Series 1: dg hand 2 view right · 0.14mm/px · 2 of 2 slices shown]
[im 1/2]
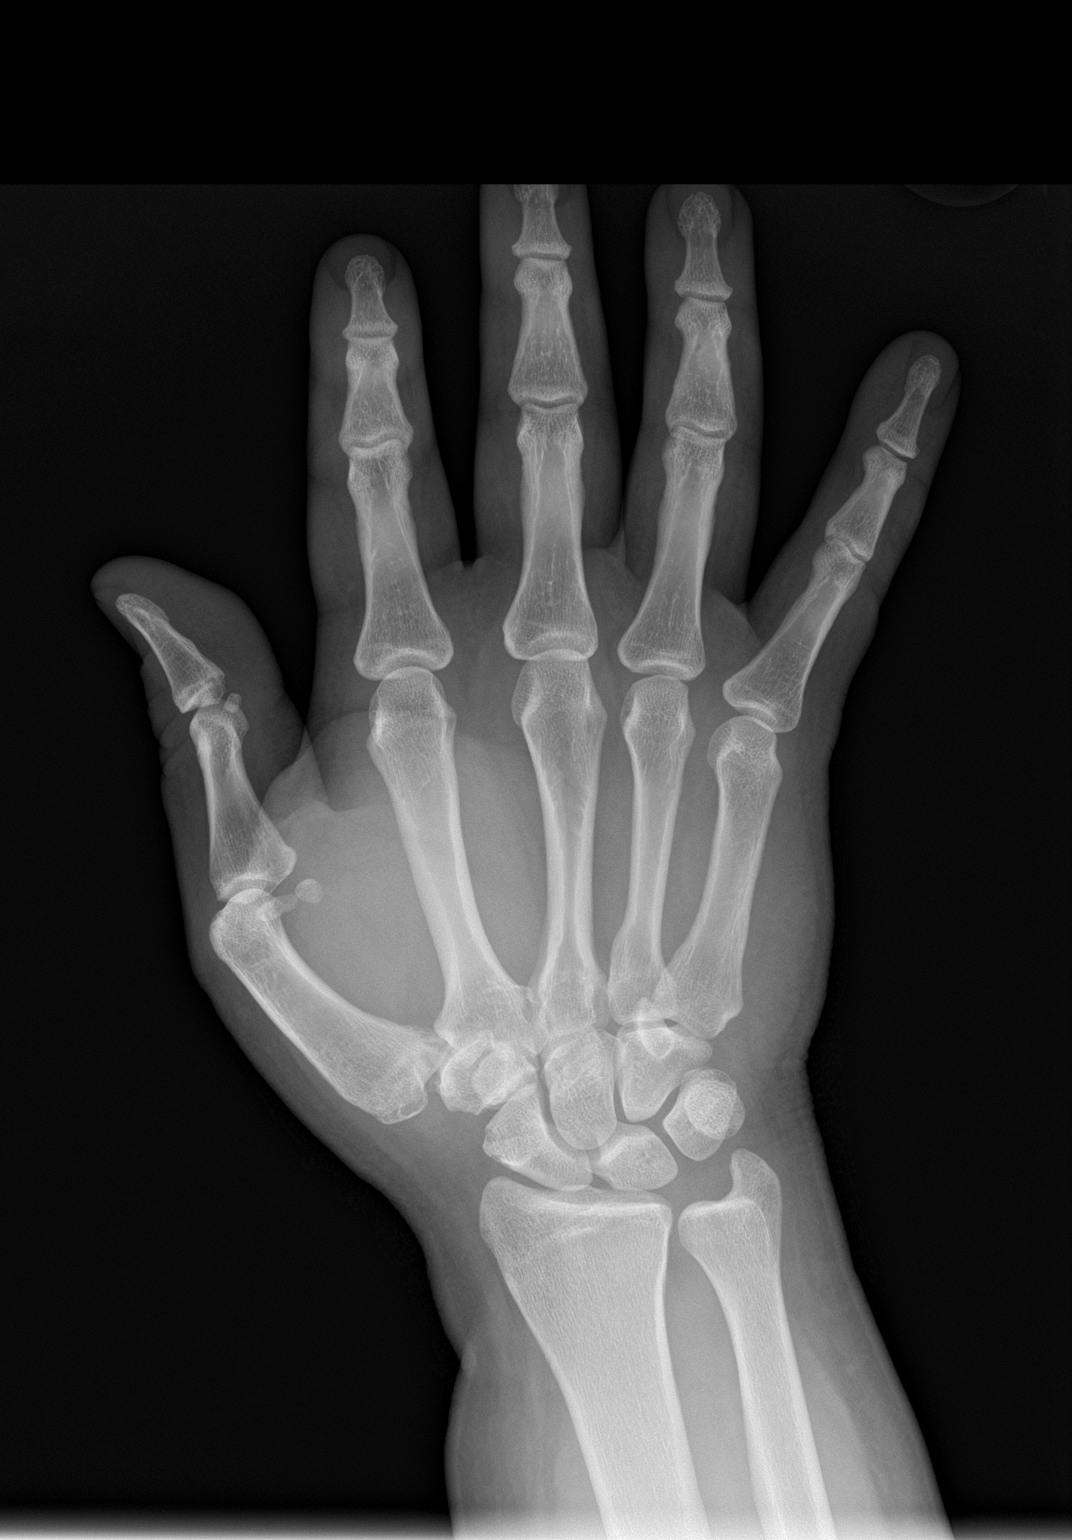
[im 2/2]
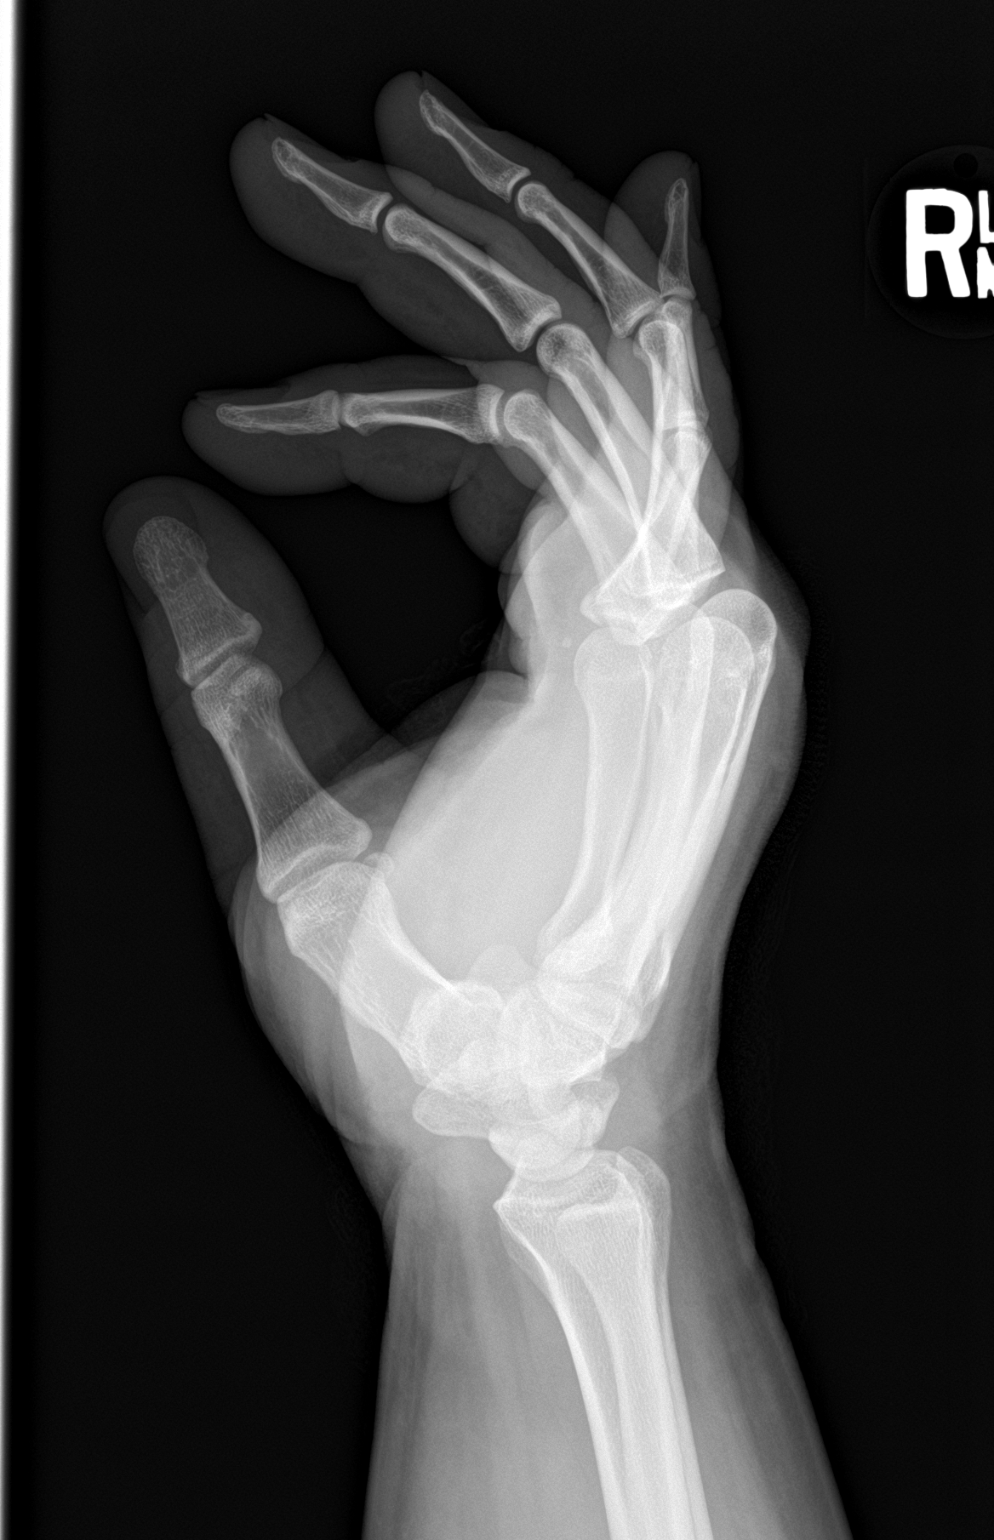

[2 of 2 positions shown; findings below may reference images not displayed]

FINDINGS: There is no acute fracture or dislocation. The bones are well
mineralized. No arthritic changes. The soft tissues appear
unremarkable. No radiopaque foreign object noted.
IMPRESSION: Negative.

## 2018-05-21 ENCOUNTER — Other Ambulatory Visit: Payer: Self-pay | Admitting: Family Medicine

## 2018-05-21 ENCOUNTER — Encounter: Payer: Self-pay | Admitting: Family Medicine

## 2018-05-21 ENCOUNTER — Ambulatory Visit (INDEPENDENT_AMBULATORY_CARE_PROVIDER_SITE_OTHER): Payer: BLUE CROSS/BLUE SHIELD | Admitting: Family Medicine

## 2018-05-21 VITALS — BP 126/60 | HR 79 | Temp 97.6°F | Resp 16 | Ht 70.0 in | Wt 312.0 lb

## 2018-05-21 DIAGNOSIS — G5601 Carpal tunnel syndrome, right upper limb: Secondary | ICD-10-CM | POA: Insufficient documentation

## 2018-05-21 DIAGNOSIS — Z0000 Encounter for general adult medical examination without abnormal findings: Secondary | ICD-10-CM

## 2018-05-21 DIAGNOSIS — J4530 Mild persistent asthma, uncomplicated: Secondary | ICD-10-CM | POA: Diagnosis not present

## 2018-05-21 DIAGNOSIS — Z6841 Body Mass Index (BMI) 40.0 and over, adult: Secondary | ICD-10-CM | POA: Insufficient documentation

## 2018-05-21 DIAGNOSIS — J3089 Other allergic rhinitis: Secondary | ICD-10-CM | POA: Diagnosis not present

## 2018-05-21 DIAGNOSIS — Z114 Encounter for screening for human immunodeficiency virus [HIV]: Secondary | ICD-10-CM

## 2018-05-21 DIAGNOSIS — J4540 Moderate persistent asthma, uncomplicated: Secondary | ICD-10-CM | POA: Insufficient documentation

## 2018-05-21 MED ORDER — MONTELUKAST SODIUM 10 MG PO TABS
10.0000 mg | ORAL_TABLET | Freq: Every day | ORAL | 5 refills | Status: DC
Start: 2018-05-21 — End: 2019-07-03

## 2018-05-21 MED ORDER — LORATADINE 10 MG PO TABS: 10.0000 mg | ORAL_TABLET | Freq: Every day | ORAL | 11 refills | Status: DC

## 2018-05-21 MED ORDER — ALBUTEROL SULFATE HFA 108 (90 BASE) MCG/ACT IN AERS: 2.0000 | INHALATION_SPRAY | RESPIRATORY_TRACT | 3 refills | Status: DC | PRN

## 2018-05-21 NOTE — Assessment & Plan Note (Signed)
Wt approx 300 lbs, today weighed with clothes compared to his baseline at home Encouraged lifestyle diet / exercise - review at future annual phys and labs

## 2018-05-21 NOTE — Assessment & Plan Note (Signed)
Currently stable without active exacerbation Consistent with mild persistent asthma, without maintenance therapy currently Long history of asthma, environmental/allergy triggers / URI Last significant exacerbation in 2017  Plan Discussion on treatment options - given some frequency of minor flares and regular symptoms, agree that maintenance therapy would be beneficial - start with Singulair 10mg  nightly as discussed for both asthma / allergy - Add claritin anti histamine 10mg  daily for more prevention - Refill / Continue Albuterol 2 puffs q 4-6 hour PRN  Follow-up

## 2018-05-21 NOTE — Patient Instructions (Addendum)
Thank you for coming to the office today.  Future ask about Family History - Heart Disease (heart attack < age 31, high blood pressure, cholesterol)  Singulair (Montelukast) 10mg  nightly at bedtime - every day - should help both asthma and allergies  In near future after tried Singulair, can also add OTC Loratadine (Claritin) 10mg  daily in morning, this is non drowsy helps PREVENT allergy symptoms, will not cure them if you have them already  Refilled ALbuterol rescue inhaler as well. If using TOO FREQUENTLY >3 x a week or almost daily then need to notify office, may have asthma flare up  DUE for FASTING BLOOD WORK (no food or drink after midnight before the lab appointment, only water or coffee without cream/sugar on the morning of)  SCHEDULE "Lab Only" visit in the morning at the clinic for lab draw in 4 WEEKS   - Make sure Lab Only appointment is at about 1 week before your next appointment, so that results will be available  For Lab Results, once available within 2-3 days of blood draw, you can can log in to MyChart online to view your results and a brief explanation. Also, we can discuss results at next follow-up visit.   Please schedule a Follow-up Appointment to: Return in about 4 weeks (around 06/18/2018) for Annual Physical.  If you have any other questions or concerns, please feel free to call the office or send a message through MyChart. You may also schedule an earlier appointment if necessary.  Additionally, you may be receiving a survey about your experience at our office within a few days to 1 week by e-mail or mail. We value your feedback.  Saralyn Pilar, DO [facility], [place]

## 2018-05-21 NOTE — Assessment & Plan Note (Addendum)
Chronic problem Outdoor environmental allergies No prior testing Triggers asthma  Start Singulair 10mg  nightly and OTC Loratadine 10mg  AM daily - reviewed benefits / preventative goal of therapy Future follow-up asthma progress

## 2018-05-21 NOTE — Progress Notes (Signed)
Subjective:    Patient ID: Richard Greer, male    DOB: 17-Sep-1985, 32 y.o.   MRN: [id number]  Richard Greer is a 33 y.o. male presenting on 05/21/2018 for Establish Care and Asthma  Here to establish care. No prior PCP regularly. Has had intermittent care since pediatrician.  HPI   Asthma, mild persistent Chronic history of childhood asthma that has been persistent into adulthood. He has not had significant long term treatment for it or maintenance therapy. Previously managed by pediatrician. He has triggers with seasonal environmental allergies and outdoor activity , weather changes, and URI bronchitis. Last significant flare in 03/2016 at [facility] treated with Albuterol Z-pak and Prednisone.  - He has done well this past 1 year, usually worse breathing in heat and humidity - he does better with cooling air, sleeps better with cool air. - Avg year 2 asthma flare ups, usually treated with his Albuterol inhaler - He admits some slight heaviness in breathing - recently triggered with exertion at times- he is asking if this is related, seems to be breathing related - Has tried Bronkaid before OTC (ephedrine) and it has elevated his BP before. - Denies chest pain, chest pressure, sweating diaphoresis, nausea vomiting  History of Tobacco / Nicotine Abuse He remains smoke/vape free currently quit about 5 months ago 12/2017. Overall only smoked for about 2 years approx small infrequent amount cigarettes tried to quit with vaping and then used for a while.  History of R index finger lacerated tendon s/p repair - Previously [facility] 08/2016 Dr Rosita Kea. Initial injury due to shard of glass cut tendon. - Doing well now without complication.  Additional history Carpal Tunnel R - report chronic issue for years with R sided carpal tunnel symptoms, worse with active lifting and grip and labor. Has not tried treatments, worn splint or other intervention, he has considered future surgical fix before  though.  Health Maintenance: Due for Flu Shot, declines today despite counseling on benefits  Due routine HIV screen with next labs  UTD TDap 2018  Depression screen PHQ 2/9 05/21/2018  Decreased Interest 0  Down, Depressed, Hopeless 0  PHQ - 2 Score 0    Past Medical History:  Diagnosis Date  . Asthma    well-controlled  . GERD (gastroesophageal reflux disease)    h/o   Past Surgical History:  Procedure Laterality Date  . NO PAST SURGERIES    . REPAIR EXTENSOR TENDON Right 09/15/2016   Procedure: REPAIR EXTENSOR TENDON RIGHT INDEX FINGER;  Surgeon: Kennedy Bucker, MD;  Location: [facility] ORS;  Service: Orthopedics;  Laterality: Right;   Social History   Socioeconomic History  . Marital status: Single    Spouse name: Not on file  . Number of children: Not on file  . Years of education: [employer]  . Highest education level: High school graduate  Occupational History  . Occupation: Automotive engineer    Comment: Systems developer  Social Needs  . Financial resource strain: Not on file  . Food insecurity:    Worry: Not on file    Inability: Not on file  . Transportation needs:    Medical: Not on file    Non-medical: Not on file  Tobacco Use  . Smoking status: Former Smoker    Packs/day: 0.15    Years: 2.00    Pack years: 0.30    Types: Cigarettes    Last attempt to quit: 12/16/2017    Years since quitting: 0.4  . Smokeless  tobacco: Former Engineer, water and Sexual Activity  . Alcohol use: Yes    Comment: occ  . Drug use: No  . Sexual activity: Not on file  Lifestyle  . Physical activity:    Days per week: Not on file    Minutes per session: Not on file  . Stress: Not on file  Relationships  . Social connections:    Talks on phone: Not on file    Gets together: Not on file    Attends religious service: Not on file    Active member of club or organization: Not on file    Attends meetings of clubs or organizations: Not on file    Relationship status: Not on file  .  Intimate partner violence:    Fear of current or ex partner: Not on file    Emotionally abused: Not on file    Physically abused: Not on file    Forced sexual activity: Not on file  Other Topics Concern  . Not on file  Social History Narrative  . Not on file   Family History  Problem Relation Age of Onset  . Stroke Father   . Diabetes Father 72  . Cancer Maternal Uncle        pancreatic  . Cancer Maternal Grandmother        esophageal  . Cancer Maternal Grandfather        pancreatic  . Heart disease Paternal Grandfather   . Colon cancer Neg Hx   . Prostate cancer Neg Hx    No current outpatient medications on file prior to visit.   No current facility-administered medications on file prior to visit.     Review of Systems Per HPI unless specifically indicated above     Objective:    BP 126/60   Pulse 79   Temp 97.6 F (36.4 C) (Oral)   Resp 16   Ht 5\' 10"  (1.778 m)   Wt (!) 312 lb (141.5 kg)   BMI 44.77 kg/m   Wt Readings from Last 3 Encounters:  05/21/18 (!) 312 lb (141.5 kg)  09/15/16 (!) 308 lb (139.7 kg)  09/14/16 (!) 309 lb (140.2 kg)    Physical Exam  Constitutional: He is oriented to person, place, and time. He appears well-developed and well-nourished. No distress.  Well-appearing, comfortable, cooperative  HENT:  Head: Normocephalic and atraumatic.  Mouth/Throat: Oropharynx is clear and moist.  Eyes: Conjunctivae are normal. Right eye exhibits no discharge. Left eye exhibits no discharge.  Cardiovascular: Normal rate, regular rhythm, normal heart sounds and intact distal pulses.  No murmur heard. Pulmonary/Chest: Effort normal and breath sounds normal. No stridor. No respiratory distress. He has no wheezes.  Possibly very slight reduced air movement generalized, non focal. Speaks full sentences. Does not seem to have labored breathing.  Musculoskeletal: He exhibits no edema.  Neurological: He is alert and oriented to person, place, and time.    Skin: Skin is warm and dry. No rash noted. He is not diaphoretic. No erythema.  Psychiatric: He has a normal mood and affect. His behavior is normal.  Well groomed, good eye contact, normal speech and thoughts  Nursing note and vitals reviewed.  Results for orders placed or performed during the hospital encounter of 04/05/16  Rapid strep screen  Result Value Ref Range   Streptococcus, Group A Screen (Direct) NEGATIVE NEGATIVE  Rapid Influenza A&B Antigens ([facility] only)  Result Value Ref Range   Influenza A ([facility]) NEGATIVE NEGATIVE  Influenza B ([facility]) NEGATIVE NEGATIVE  Culture, group A strep  Result Value Ref Range   Specimen Description THROAT    Special Requests NONE Reflexed from [id number]    Culture      NO GROUP A STREP (S.PYOGENES) ISOLATED Performed at [facility]    Report Status 04/08/2016 FINAL       Assessment & Plan:   Problem List Items Addressed This Visit    Environmental and seasonal allergies    Chronic problem Outdoor environmental allergies No prior testing Triggers asthma  Start Singulair 10mg  nightly and OTC Loratadine 10mg  AM daily - reviewed benefits / preventative goal of therapy Future follow-up asthma progress       Relevant Medications   montelukast (SINGULAIR) 10 MG tablet   loratadine (CLARITIN) 10 MG tablet   Mild persistent asthma without complication - Primary    Currently stable without active exacerbation Consistent with mild persistent asthma, without maintenance therapy currently Long history of asthma, environmental/allergy triggers / URI Last significant exacerbation in 2017  Plan Discussion on treatment options - given some frequency of minor flares and regular symptoms, agree that maintenance therapy would be beneficial - start with Singulair 10mg  nightly as discussed for both asthma / allergy - Add claritin anti histamine 10mg  daily for more prevention - Refill / Continue Albuterol 2 puffs q 4-6 hour PRN  Follow-up       Relevant Medications   albuterol (PROVENTIL HFA;VENTOLIN HFA) 108 (90 Base) MCG/ACT inhaler   montelukast (SINGULAIR) 10 MG tablet   Morbid obesity with BMI of 40.0-44.9, adult (HCC)    Wt approx 300 lbs, today weighed with clothes compared to his baseline at home Encouraged lifestyle diet / exercise - review at future annual phys and labs         Meds ordered this encounter  Medications  . albuterol (PROVENTIL HFA;VENTOLIN HFA) 108 (90 Base) MCG/ACT inhaler    Sig: Inhale 2 puffs into the lungs every 4 (four) hours as needed for wheezing or shortness of breath (cough).    Dispense:  1 Inhaler    Refill:  3  . montelukast (SINGULAIR) 10 MG tablet    Sig: Take 1 tablet (10 mg total) by mouth at bedtime.    Dispense:  30 tablet    Refill:  5  . loratadine (CLARITIN) 10 MG tablet    Sig: Take 1 tablet (10 mg total) by mouth daily. Use for 4-6 weeks then stop, and use as needed or seasonally    Dispense:  30 tablet    Refill:  11     Follow up plan: Return in about 4 weeks (around 06/18/2018) for Annual Physical.   Future labs ordered for 06/26/18  Saralyn Pilar, DO [facility] 05/21/2018, 4:00 PM

## 2018-06-26 ENCOUNTER — Other Ambulatory Visit: Payer: BLUE CROSS/BLUE SHIELD

## 2018-06-26 DIAGNOSIS — J4530 Mild persistent asthma, uncomplicated: Secondary | ICD-10-CM

## 2018-06-26 DIAGNOSIS — Z114 Encounter for screening for human immunodeficiency virus [HIV]: Secondary | ICD-10-CM

## 2018-06-26 DIAGNOSIS — Z0000 Encounter for general adult medical examination without abnormal findings: Secondary | ICD-10-CM

## 2018-06-26 DIAGNOSIS — Z6841 Body Mass Index (BMI) 40.0 and over, adult: Secondary | ICD-10-CM

## 2018-06-27 LAB — COMPLETE METABOLIC PANEL WITH GFR
AG RATIO: 1.8 (calc) (ref 1.0–2.5)
ALKALINE PHOSPHATASE (APISO): 95 U/L (ref 40–115)
ALT: 26 U/L (ref 9–46)
AST: 17 U/L (ref 10–40)
Albumin: 4.3 g/dL (ref 3.6–5.1)
BUN: 13 mg/dL (ref 7–25)
CO2: 29 mmol/L (ref 20–32)
CREATININE: 0.8 mg/dL (ref 0.60–1.35)
Calcium: 8.9 mg/dL (ref 8.6–10.3)
Chloride: 102 mmol/L (ref 98–110)
GFR, Est African American: 138 mL/min/{1.73_m2} (ref >=60)
GFR, Est Non African American: 119 mL/min/{1.73_m2} (ref >=60)
Globulin: 2.4 g/dL (calc) (ref 1.9–3.7)
Glucose, Bld: 93 mg/dL (ref 65–99)
POTASSIUM: 4 mmol/L (ref 3.5–5.3)
Sodium: 140 mmol/L (ref 135–146)
TOTAL PROTEIN: 6.7 g/dL (ref 6.1–8.1)
Total Bilirubin: 0.6 mg/dL (ref 0.2–1.2)

## 2018-06-27 LAB — CBC WITH DIFFERENTIAL/PLATELET
BASOS ABS: 30 {cells}/uL (ref 0–200)
BASOS PCT: 0.4 %
Eosinophils Absolute: 170 cells/uL (ref 15–500)
Eosinophils Relative: 2.3 %
HCT: 45.3 % (ref 38.5–50.0)
HEMOGLOBIN: 15.1 g/dL (ref 13.2–17.1)
Lymphs Abs: 1162 cells/uL (ref 850–3900)
MCH: 28.4 pg (ref 27.0–33.0)
MCHC: 33.3 g/dL (ref 32.0–36.0)
MCV: 85.2 fL (ref 80.0–100.0)
MONOS PCT: 9.2 %
MPV: 10.1 fL (ref 7.5–12.5)
NEUTROS ABS: 5358 {cells}/uL (ref 1500–7800)
Neutrophils Relative %: 72.4 %
Platelets: 245 10*3/uL (ref 140–400)
RBC: 5.32 10*6/uL (ref 4.20–5.80)
RDW: 12.8 % (ref 11.0–15.0)
Total Lymphocyte: 15.7 %
WBC mixed population: 681 cells/uL (ref 200–950)
WBC: 7.4 10*3/uL (ref 3.8–10.8)

## 2018-06-27 LAB — LIPID PANEL
CHOL/HDL RATIO: 4.7 (calc) (ref <5.0)
Cholesterol: 189 mg/dL (ref <200)
HDL: 40 mg/dL — ABNORMAL LOW (ref >40)
LDL CHOLESTEROL (CALC): 127 mg/dL — AB
Non-HDL Cholesterol (Calc): 149 mg/dL (calc) — ABNORMAL HIGH (ref <130)
Triglycerides: 109 mg/dL (ref <150)

## 2018-06-27 LAB — HEMOGLOBIN A1C
HEMOGLOBIN A1C: 5.4 %{Hb} (ref <5.7)
Mean Plasma Glucose: 108 (calc)
eAG (mmol/L): 6 (calc)

## 2018-06-27 LAB — HIV ANTIBODY (ROUTINE TESTING W REFLEX): HIV 1&2 Ab, 4th Generation: NONREACTIVE

## 2018-07-02 ENCOUNTER — Encounter: Payer: Self-pay | Admitting: Family Medicine

## 2018-07-02 DIAGNOSIS — E7800 Pure hypercholesterolemia, unspecified: Secondary | ICD-10-CM | POA: Insufficient documentation

## 2018-07-03 ENCOUNTER — Encounter: Payer: Self-pay | Admitting: Family Medicine

## 2018-07-03 ENCOUNTER — Ambulatory Visit (INDEPENDENT_AMBULATORY_CARE_PROVIDER_SITE_OTHER): Payer: BLUE CROSS/BLUE SHIELD | Admitting: Family Medicine

## 2018-07-03 ENCOUNTER — Other Ambulatory Visit: Payer: Self-pay | Admitting: Family Medicine

## 2018-07-03 VITALS — BP 98/63 | HR 77 | Temp 98.3°F | Resp 16 | Ht 70.0 in | Wt 317.0 lb

## 2018-07-03 DIAGNOSIS — Z0000 Encounter for general adult medical examination without abnormal findings: Secondary | ICD-10-CM | POA: Diagnosis not present

## 2018-07-03 DIAGNOSIS — K648 Other hemorrhoids: Secondary | ICD-10-CM

## 2018-07-03 DIAGNOSIS — J3089 Other allergic rhinitis: Secondary | ICD-10-CM

## 2018-07-03 DIAGNOSIS — Z6841 Body Mass Index (BMI) 40.0 and over, adult: Secondary | ICD-10-CM

## 2018-07-03 DIAGNOSIS — J4530 Mild persistent asthma, uncomplicated: Secondary | ICD-10-CM

## 2018-07-03 DIAGNOSIS — K644 Residual hemorrhoidal skin tags: Secondary | ICD-10-CM

## 2018-07-03 DIAGNOSIS — E7800 Pure hypercholesterolemia, unspecified: Secondary | ICD-10-CM

## 2018-07-03 DIAGNOSIS — G5601 Carpal tunnel syndrome, right upper limb: Secondary | ICD-10-CM

## 2018-07-03 NOTE — Patient Instructions (Addendum)
Thank you for coming to the office today.  1. Chemistry - Normal results, including electrolytes, kidney and liver function. Normal fasting blood sugar   2. Hemoglobin A1c (Diabetes screening) - 5.4, normal not in range of Pre-Diabetes (>5.7 to 6.4)   3. Routine screening HIV - Negative.  4. Cholesterol - Mild elevated LDL "bad cholesterol" to 127, goal is < 130. Borderline HDL 40. Otherwise normal.  5. CBC Blood Counts - Normal, no anemia, other abnormality  --------------------------------------------------------------------  Most likely have some Arthritis within R hand and wrist - keep using tylenol, wrist splint as tolerated, and may consider future re-evaluation with X-ray and rx medication anti inflammatory.  ------------------------------------------------------  May try Sitz Bath for hemorrhoids - if worsening and bleeding and severe pain, schedule as soon as possible, sometimes need a procedure done by surgeon.  --------------------------------------  For Constipation (less frequent bowel movement that can be hard dry or involve straining).  Recommend trying OTC Miralax 17g = 1 capful in large glass water once daily for now, try several days to see if working, goal is soft stool or BM 1-2 times daily, if too loose then reduce dose or try every other day. If not effective may need to increase it to 2 doses at once in AM or may do 1 in morning and 1 in afternoon/evening  - This medicine is very safe and can be used often without any problem and will not make you dehydrated. It is good for use on AS NEEDED BASIS or even MAINTENANCE therapy for longer term for several days to weeks at a time to help regulate bowel movements  Other more natural remedies or preventative treatment: - Increase hydration with water - Increase fiber in diet (high fiber foods = vegetables, leafy greens, oats/grains) - May take OTC Fiber supplement (metamucil powder or pill/gummy) - May try OTC  Probiotic    DUE for FASTING BLOOD WORK (no food or drink after midnight before the lab appointment, only water or coffee without cream/sugar on the morning of)  SCHEDULE "Lab Only" visit in the morning at the clinic for lab draw in 1 YEAR  - Make sure Lab Only appointment is at about 1 week before your next appointment, so that results will be available  For Lab Results, once available within 2-3 days of blood draw, you can can log in to MyChart online to view your results and a brief explanation. Also, we can discuss results at next follow-up visit.   Please schedule a Follow-up Appointment to: Return in about 1 year (around 07/04/2019) for Annual Physical.  If you have any other questions or concerns, please feel free to call the office or send a message through MyChart. You may also schedule an earlier appointment if necessary.  Additionally, you may be receiving a survey about your experience at our office within a few days to 1 week by e-mail or mail. We value your feedback.  Saralyn Pilar, DO [facility], [facility]   Heart-Healthy Eating Plan Many factors influence your heart health, including eating and exercise habits. Heart (coronary) risk increases with abnormal blood fat (lipid) levels. Heart-healthy meal planning includes limiting unhealthy fats, increasing healthy fats, and making other small dietary changes. This includes maintaining a healthy body weight to help keep lipid levels within a normal range. What is my plan? Your health care provider recommends that you:  Get no more than _________% of the total calories in your daily diet from fat.  Limit your  intake of saturated fat to less than _________% of your total calories each day.  Limit the amount of cholesterol in your diet to less than _________ mg per day.  What types of fat should I choose?  Choose healthy fats more often. Choose monounsaturated and polyunsaturated fats, such as olive  oil and canola oil, flaxseeds, walnuts, almonds, and seeds.  Eat more omega-3 fats. Good choices include salmon, mackerel, sardines, tuna, flaxseed oil, and ground flaxseeds. Aim to eat fish at least two times each week.  Limit saturated fats. Saturated fats are primarily found in animal products, such as meats, butter, and cream. Plant sources of saturated fats include palm oil, palm kernel oil, and coconut oil.  Avoid foods with partially hydrogenated oils in them. These contain trans fats. Examples of foods that contain trans fats are stick margarine, some tub margarines, cookies, crackers, and other baked goods. What general guidelines do I need to follow?  Check food labels carefully to identify foods with trans fats or high amounts of saturated fat.  Fill one half of your plate with vegetables and green salads. Eat 4-5 servings of vegetables per day. A serving of vegetables equals 1 cup of raw leafy vegetables,  cup of raw or cooked cut-up vegetables, or  cup of vegetable juice.  Fill one fourth of your plate with whole grains. Look for the word "whole" as the first word in the ingredient list.  Fill one fourth of your plate with lean protein foods.  Eat 4-5 servings of fruit per day. A serving of fruit equals one medium whole fruit,  cup of dried fruit,  cup of fresh, frozen, or canned fruit, or  cup of 100% fruit juice.  Eat more foods that contain soluble fiber. Examples of foods that contain this type of fiber are apples, broccoli, carrots, beans, peas, and barley. Aim to get 20-30 g of fiber per day.  Eat more home-cooked food and less restaurant, buffet, and fast food.  Limit or avoid alcohol.  Limit foods that are high in starch and sugar.  Avoid fried foods.  Cook foods by using methods other than frying. Baking, boiling, grilling, and broiling are all great options. Other fat-reducing suggestions include: ? Removing the skin from poultry. ? Removing all visible fats  from meats. ? Skimming the fat off of stews, soups, and gravies before serving them. ? Steaming vegetables in water or broth.  Lose weight if you are overweight. Losing just 5-10% of your initial body weight can help your overall health and prevent diseases such as diabetes and heart disease.  Increase your consumption of nuts, legumes, and seeds to 4-5 servings per week. One serving of dried beans or legumes equals  cup after being cooked, one serving of nuts equals 1 ounces, and one serving of seeds equals  ounce or 1 tablespoon.  You may need to monitor your salt (sodium) intake, especially if you have high blood pressure. Talk with your health care provider or dietitian to get more information about reducing sodium. What foods can I eat? Grains  Breads, including [place], white, pita, wheat, raisin, rye, oatmeal, and [place]. Tortillas that are neither fried nor made with lard or trans fat. Low-fat rolls, including hotdog and hamburger buns and English muffins. Biscuits. Muffins. Waffles. Pancakes. Light popcorn. Whole-grain cereals. Flatbread. Melba toast. Pretzels. Breadsticks. Rusks. Low-fat snacks and crackers, including oyster, saltine, matzo, graham, animal, and rye. Rice and pasta, including brown rice and those that are made with whole wheat.  Vegetables All vegetables. Fruits All fruits, but limit coconut. Meats and Other Protein Sources Lean, well-trimmed beef, veal, pork, and lamb. Chicken and [place] without skin. All fish and shellfish. Wild duck, rabbit, pheasant, and venison. Egg whites or low-cholesterol egg substitutes. Dried beans, peas, lentils, and tofu.Seeds and most nuts. Dairy Low-fat or nonfat cheeses, including ricotta, string, and mozzarella. Skim or 1% milk that is liquid, powdered, or evaporated. Buttermilk that is made with low-fat milk. Nonfat or low-fat yogurt. Beverages Mineral water. Diet carbonated beverages. Sweets and Desserts Sherbets and fruit ices.  Honey, jam, marmalade, jelly, and syrups. Meringues and gelatins. Pure sugar candy, such as hard candy, jelly beans, gumdrops, mints, marshmallows, and small amounts of dark chocolate. [employer]. Eat all sweets and desserts in moderation. Fats and Oils Nonhydrogenated (trans-free) margarines. Vegetable oils, including soybean, sesame, sunflower, olive, peanut, safflower, corn, canola, and cottonseed. Salad dressings or mayonnaise that are made with a vegetable oil. Limit added fats and oils that you use for cooking, baking, salads, and as spreads. Other Cocoa powder. Coffee and tea. All seasonings and condiments. The items listed above may not be a complete list of recommended foods or beverages. Contact your dietitian for more options. What foods are not recommended? Grains Breads that are made with saturated or trans fats, oils, or whole milk. Croissants. Butter rolls. Cheese breads. Sweet rolls. Donuts. Buttered popcorn. Chow mein noodles. High-fat crackers, such as cheese or butter crackers. Meats and Other Protein Sources Fatty meats, such as hotdogs, short ribs, sausage, spareribs, bacon, ribeye roast or steak, and mutton. High-fat deli meats, such as salami and bologna. Caviar. Domestic duck and goose. Organ meats, such as kidney, liver, sweetbreads, brains, gizzard, chitterlings, and heart. Dairy Cream, sour cream, cream cheese, and creamed cottage cheese. Whole milk cheeses, including blue (bleu), [address], [place], [place], [address], [place], [address], [place], [place], and [place]. Whole or 2% milk that is liquid, evaporated, or condensed. Whole buttermilk. Cream sauce or high-fat cheese sauce. Yogurt that is made from whole milk. Beverages Regular sodas and drinks with added sugar. Sweets and Desserts Frosting. Pudding. Cookies. Cakes other than angel food cake. Candy that has milk chocolate or white chocolate, hydrogenated fat, butter, coconut, or unknown ingredients. Buttered  syrups. Full-fat ice cream or ice cream drinks. Fats and Oils Gravy that has suet, meat fat, or shortening. Cocoa butter, hydrogenated oils, palm oil, coconut oil, palm kernel oil. These can often be found in baked products, candy, fried foods, nondairy creamers, and whipped toppings. Solid fats and shortenings, including bacon fat, salt pork, lard, and butter. Nondairy cream substitutes, such as coffee creamers and sour cream substitutes. Salad dressings that are made of unknown oils, cheese, or sour cream. The items listed above may not be a complete list of foods and beverages to avoid. Contact your dietitian for more information. This information is not intended to replace advice given to you by your health care provider. Make sure you discuss any questions you have with your health care provider. Document Released: 04/12/2008 Document Revised: 01/22/2016 Document Reviewed: 12/26/2013 Elsevier Interactive Patient Education  [employer].

## 2018-07-03 NOTE — Assessment & Plan Note (Signed)
Wt gain recently Improving diet lifestyle Encourage regular exercise Reviewed ideal body wt goals, ideal goal first < 300, then next goal 250 lbs

## 2018-07-03 NOTE — Assessment & Plan Note (Signed)
Mostly controlled cholesterol on improving lifestyle Last lipid panel 06/2018 Calculated ASCVD 10 yr risk score = low risk  Plan: 1. Encourage improved lifestyle - low carb/cholesterol, reduce portion size, continue improving regular exercise Follow-up yearly lipid

## 2018-07-03 NOTE — Progress Notes (Signed)
Subjective:    Patient ID: Richard Greer, male    DOB: Apr 23, 1986, 32 y.o.   MRN: [id number]  Richard Greer is a 32 y.o. male presenting on 07/03/2018 for Annual Exam   HPI  Here for Annual Physical and Lab Review  Asthma, mild persistent / Environmental Seasonal Allergies - Last visit with me 05/21/18, for initial visit for same problem, treated with asthma education, started allergy treatment w/ Loratadine and Singulair, see prior notes for background information. - Interval update with improvement in his breathing - Today patient reports he is doing well, less often having any asthma flare or breathing symptoms, seems allergies are better under control - No recent flare up - He is increasing frequency of taking Claritin, still not daily - He takes Singulair 10mg  nightly PRN - seems to take it fairly often, but not every day, helps his asthma and allergies  LIFESTYLE / HYPERLIPIDEMIA / Morbid Obesity BMI >45 - Reports no concerns. Last lipid panel 06/2018, controlled total cholesterol, normal HDL >40, mild elevated LDL Not taking cholesterol med Lifestyle - Wt gain 15+ labs in past 9 months - Diet: improved now less fried foods, working on improving lower carb options - Exercise: active at work outdoor farmers market  Additional concerns Hemorrhoids vs Anal Fissure - he reports episodic small amount of red blood on toilet paper, some swelling at rectum and some episode of constipation with some straining. He states rare problem. No significant bleeding, denies dark stool or blood in stool.  History of Tobacco / Nicotine Abuse He remains smoke/vape free currently quit about 5 months ago 12/2017. Overall only smoked for about 2 years approx small infrequent amount cigarettes tried to quit with vaping and then used for a while.  Additional history Presumed Arthritis vs Carpal Tunnel R - persistent issue, seems limited improvement while working, could not wear splint, but he  has tried it other times. Worse in colder rainy weather with some stiffness and aching. Taking Tylenol PRN  Health Maintenance: Due for Flu Shot, declines today despite counseling on benefits  UTD Routine HIV screen  UTD TDap 2018  Depression screen [facility] 2/9 07/03/2018 05/21/2018  Decreased Interest 0 0  Down, Depressed, Hopeless 0 0  PHQ - 2 Score 0 0    Past Medical History:  Diagnosis Date  . Asthma    well-controlled  . GERD (gastroesophageal reflux disease)    h/o   Past Surgical History:  Procedure Laterality Date  . NO PAST SURGERIES    . REPAIR EXTENSOR TENDON Right 09/15/2016   Procedure: REPAIR EXTENSOR TENDON RIGHT INDEX FINGER;  Surgeon: Kennedy Bucker, MD;  Location: [facility] ORS;  Service: Orthopedics;  Laterality: Right;   Social History   Socioeconomic History  . Marital status: Single    Spouse name: Not on file  . Number of children: Not on file  . Years of education: [employer]  . Highest education level: High school graduate  Occupational History  . Occupation: Automotive engineer    Comment: Systems developer  Social Needs  . Financial resource strain: Not on file  . Food insecurity:    Worry: Not on file    Inability: Not on file  . Transportation needs:    Medical: Not on file    Non-medical: Not on file  Tobacco Use  . Smoking status: Former Smoker    Packs/day: 0.15    Years: 2.00    Pack years: 0.30    Types: Cigarettes  Last attempt to quit: 12/16/2017    Years since quitting: 0.5  . Smokeless tobacco: Former Engineer, water and Sexual Activity  . Alcohol use: Yes    Comment: occ  . Drug use: No  . Sexual activity: Not on file  Lifestyle  . Physical activity:    Days per week: Not on file    Minutes per session: Not on file  . Stress: Not on file  Relationships  . Social connections:    Talks on phone: Not on file    Gets together: Not on file    Attends religious service: Not on file    Active member of club or organization: Not on file     Attends meetings of clubs or organizations: Not on file    Relationship status: Not on file  . Intimate partner violence:    Fear of current or ex partner: Not on file    Emotionally abused: Not on file    Physically abused: Not on file    Forced sexual activity: Not on file  Other Topics Concern  . Not on file  Social History Narrative  . Not on file   Family History  Problem Relation Age of Onset  . Stroke Father   . Diabetes Father 40  . Cancer Maternal Uncle        pancreatic  . Cancer Maternal Grandmother        esophageal  . Cancer Maternal Grandfather        pancreatic  . Heart disease Paternal Grandfather   . Colon cancer Neg Hx   . Prostate cancer Neg Hx    Current Outpatient Medications on File Prior to Visit  Medication Sig  . albuterol (PROVENTIL HFA;VENTOLIN HFA) 108 (90 Base) MCG/ACT inhaler Inhale 2 puffs into the lungs every 4 (four) hours as needed for wheezing or shortness of breath (cough).  . loratadine (CLARITIN) 10 MG tablet Take 1 tablet (10 mg total) by mouth daily. Use for 4-6 weeks then stop, and use as needed or seasonally  . montelukast (SINGULAIR) 10 MG tablet Take 1 tablet (10 mg total) by mouth at bedtime.   No current facility-administered medications on file prior to visit.     Review of Systems  Constitutional: Negative for activity change, appetite change, chills, diaphoresis, fatigue and fever.  HENT: Negative for congestion and hearing loss.   Eyes: Negative for visual disturbance.  Respiratory: Negative for apnea, cough, choking, chest tightness, shortness of breath and wheezing.   Cardiovascular: Negative for chest pain, palpitations and leg swelling.  Gastrointestinal: Positive for anal bleeding (rarely, hemorrhoid, rectal). Negative for abdominal pain, blood in stool, constipation, diarrhea, nausea and vomiting.  Endocrine: Negative for cold intolerance and polyuria.  Genitourinary: Negative for decreased urine volume, difficulty  urinating, dysuria, frequency, hematuria and urgency.  Musculoskeletal: Negative for arthralgias, back pain and neck pain.  Skin: Negative for rash.  Allergic/Immunologic: Positive for environmental allergies.  Neurological: Negative for dizziness, weakness, light-headedness, numbness and headaches.  Hematological: Negative for adenopathy.  Psychiatric/Behavioral: Negative for behavioral problems, dysphoric mood and sleep disturbance. The patient is not nervous/anxious.    Per HPI unless specifically indicated above     Objective:    BP 98/63   Pulse 77   Temp 98.3 F (36.8 C) (Oral)   Resp 16   Ht 5\' 10"  (1.778 m)   Wt (!) 317 lb (143.8 kg)   BMI 45.48 kg/m   Wt Readings from Last 3 Encounters:  07/03/18 Marland Kitchen)  317 lb (143.8 kg)  05/21/18 (!) 312 lb (141.5 kg)  09/15/16 (!) 308 lb (139.7 kg)    Physical Exam Vitals signs and nursing note reviewed.  Constitutional:      General: He is not in acute distress.    Appearance: He is well-developed. He is not diaphoretic.     Comments: Well-appearing, comfortable, cooperative, obese  HENT:     Head: Normocephalic and atraumatic.  Eyes:     General:        Right eye: No discharge.        Left eye: No discharge.     Conjunctiva/sclera: Conjunctivae normal.     Pupils: Pupils are equal, round, and reactive to light.  Neck:     Musculoskeletal: Normal range of motion and neck supple.     Thyroid: No thyromegaly.  Cardiovascular:     Rate and Rhythm: Normal rate and regular rhythm.     Heart sounds: Normal heart sounds. No murmur.  Pulmonary:     Effort: Pulmonary effort is normal. No respiratory distress.     Breath sounds: Normal breath sounds. No wheezing or rales.     Comments: Good air movement. No wheezing or abnormality Abdominal:     General: Bowel sounds are normal. There is no distension.     Palpations: Abdomen is soft. There is no mass.     Tenderness: There is no abdominal tenderness.  Genitourinary:     Comments: Declined rectal exam Musculoskeletal: Normal range of motion.        General: No tenderness.     Comments: Upper / Lower Extremities: - Normal muscle tone, strength bilateral upper extremities 5/5, lower extremities 5/5  Lymphadenopathy:     Cervical: No cervical adenopathy.  Skin:    General: Skin is warm and dry.     Findings: No erythema or rash.  Neurological:     Mental Status: He is alert and oriented to person, place, and time.     Comments: Distal sensation intact to light touch all extremities  Psychiatric:        Behavior: Behavior normal.     Comments: Well groomed, good eye contact, normal speech and thoughts    Results for orders placed or performed in visit on 06/26/18  HIV Antibody (routine testing w rflx)  Result Value Ref Range   HIV 1&2 Ab, 4th Generation NON-REACTIVE NON-REACTI  Lipid panel  Result Value Ref Range   Cholesterol 189 <200 mg/dL   HDL 40 (L) >16>40 mg/dL   Triglycerides 109109 <604<150 mg/dL   LDL Cholesterol (Calc) 127 (H) mg/dL (calc)   Total CHOL/HDL Ratio 4.7 <5.0 (calc)   Non-HDL Cholesterol (Calc) 149 (H) <130 mg/dL (calc)  COMPLETE METABOLIC PANEL WITH GFR  Result Value Ref Range   Glucose, Bld 93 65 - 99 mg/dL   BUN 13 7 - 25 mg/dL   Creat 5.400.80 9.810.60 - 1.911.35 mg/dL   GFR, Est Non African American 119 > OR = 60 mL/min/1.5973m2   GFR, Est African American 138 > OR = 60 mL/min/1.8073m2   BUN/Creatinine Ratio NOT APPLICABLE 6 - 22 (calc)   Sodium 140 135 - 146 mmol/L   Potassium 4.0 3.5 - 5.3 mmol/L   Chloride 102 98 - 110 mmol/L   CO2 29 20 - 32 mmol/L   Calcium 8.9 8.6 - 10.3 mg/dL   Total Protein 6.7 6.1 - 8.1 g/dL   Albumin 4.3 3.6 - 5.1 g/dL   Globulin 2.4 1.9 - 3.7 g/dL (  calc)   AG Ratio 1.8 1.0 - 2.5 (calc)   Total Bilirubin 0.6 0.2 - 1.2 mg/dL   Alkaline phosphatase (APISO) 95 40 - 115 U/L   AST 17 10 - 40 U/L   ALT 26 9 - 46 U/L  CBC with Differential/Platelet  Result Value Ref Range   WBC 7.4 3.8 - 10.8 Thousand/uL   RBC  5.32 4.20 - 5.80 Million/uL   Hemoglobin 15.1 13.2 - 17.1 g/dL   HCT 16.1 09.6 - 04.5 %   MCV 85.2 80.0 - 100.0 fL   MCH 28.4 27.0 - 33.0 pg   MCHC 33.3 32.0 - 36.0 g/dL   RDW 40.9 81.1 - 91.4 %   Platelets 245 140 - 400 Thousand/uL   MPV 10.1 7.5 - 12.5 fL   Neutro Abs 5,358 1,500 - 7,800 cells/uL   Lymphs Abs 1,162 850 - 3,900 cells/uL   WBC mixed population 681 200 - 950 cells/uL   Eosinophils Absolute 170 15 - 500 cells/uL   Basophils Absolute 30 0 - 200 cells/uL   Neutrophils Relative % 72.4 %   Total Lymphocyte 15.7 %   Monocytes Relative 9.2 %   Eosinophils Relative 2.3 %   Basophils Relative 0.4 %  Hemoglobin A1c  Result Value Ref Range   Hgb A1c MFr Bld 5.4 <5.7 % of total Hgb   Mean Plasma Glucose 108 (calc)   eAG (mmol/L) 6.0 (calc)      Assessment & Plan:   Problem List Items Addressed This Visit    Carpal tunnel syndrome on right    Stable Still try splint PRN Follow-up if worsening - consider eval for arthritis x-ray or other more aggressive rx therapy, NSAID      Environmental and seasonal allergies    Improved On Loratadine, Singulair      Hypercholesterolemia    Mostly controlled cholesterol on improving lifestyle Last lipid panel 06/2018 Calculated ASCVD 10 yr risk score = low risk  Plan: 1. Encourage improved lifestyle - low carb/cholesterol, reduce portion size, continue improving regular exercise Follow-up yearly lipid       Internal and external hemorrhoids without complication    Clinically by history most likely mixture of hemorrhoids vs anal fissure Seems uncomplicated, only episodic problem  Plan Advised routine care for bowels, prevent constipation, treat symptomatic PRN - Per AVS Handout, inc fiber, hydration, may do Sitz Bath - Follow-up if significant flare for further evaluation      Mild persistent asthma without complication    Improved on Singulair / allergy therapy Currently stable without active  exacerbation Consistent with mild persistent asthma Long history of asthma, environmental/allergy triggers / URI Last significant exacerbation in 2017  Plan Continue Singulair 10mg  nightly (try to take more regularly vs PRN when needed) as discussed for both asthma / allergy Continue Claritin 10mg  daily PRN ONLY Continue Albuterol 2 puffs q 4-6 hour PRN  Follow-up      Morbid obesity with BMI of 40.0-44.9, adult (HCC)    Wt gain recently Improving diet lifestyle Encourage regular exercise Reviewed ideal body wt goals, ideal goal first < 300, then next goal 250 lbs       Other Visit Diagnoses    Annual physical exam    -  Primary      Updated Health Maintenance information Reviewed recent lab results with patient Encouraged improvement to lifestyle with diet and exercise - Goal of weight loss   No orders of the defined types were placed in this  encounter.  Follow up plan: Return in about 1 year (around 07/04/2019) for Annual Physical.  Future labs ordered for 06/28/19  Saralyn Pilar, DO [facility] 07/03/2018, 2:10 PM

## 2018-07-03 NOTE — Assessment & Plan Note (Signed)
Improved on Singulair / allergy therapy Currently stable without active exacerbation Consistent with mild persistent asthma Long history of asthma, environmental/allergy triggers / URI Last significant exacerbation in 2017  Plan Continue Singulair 10mg  nightly (try to take more regularly vs PRN when needed) as discussed for both asthma / allergy Continue Claritin 10mg  daily PRN ONLY Continue Albuterol 2 puffs q 4-6 hour PRN  Follow-up

## 2018-07-03 NOTE — Assessment & Plan Note (Signed)
Improved On Loratadine, Singulair

## 2018-07-03 NOTE — Assessment & Plan Note (Addendum)
Stable Still try splint PRN Follow-up if worsening - consider eval for arthritis x-ray or other more aggressive rx therapy, NSAID

## 2018-07-03 NOTE — Assessment & Plan Note (Signed)
Clinically by history most likely mixture of hemorrhoids vs anal fissure Seems uncomplicated, only episodic problem  Plan Advised routine care for bowels, prevent constipation, treat symptomatic PRN - Per AVS Handout, inc fiber, hydration, may do Sitz Bath - Follow-up if significant flare for further evaluation

## 2018-08-16 ENCOUNTER — Ambulatory Visit
Admission: EM | Admit: 2018-08-16 | Discharge: 2018-08-16 | Disposition: A | Discharge status: Home / Self Care | Payer: BLUE CROSS/BLUE SHIELD | Attending: Emergency Medicine | Admitting: Emergency Medicine

## 2018-08-16 DIAGNOSIS — J4521 Mild intermittent asthma with (acute) exacerbation: Secondary | ICD-10-CM | POA: Diagnosis not present

## 2018-08-16 DIAGNOSIS — B9789 Other viral agents as the cause of diseases classified elsewhere: Secondary | ICD-10-CM | POA: Diagnosis not present

## 2018-08-16 DIAGNOSIS — J069 Acute upper respiratory infection, unspecified: Secondary | ICD-10-CM

## 2018-08-16 MED ORDER — PREDNISONE 10 MG PO TABS: ORAL_TABLET | ORAL | 0 refills | Status: DC

## 2018-08-16 MED ORDER — DOXYCYCLINE HYCLATE 100 MG PO CAPS: 100.0000 mg | ORAL_CAPSULE | Freq: Two times a day (BID) | ORAL | 0 refills | Status: DC

## 2018-08-16 MED ORDER — HYDROCOD POLST-CPM POLST ER 10-8 MG/5ML PO SUER: 5.0000 mL | Freq: Every evening | ORAL | 0 refills | Status: DC | PRN

## 2018-08-16 NOTE — Discharge Instructions (Addendum)
Take medication as prescribed. Rest. Drink plenty of fluids.  Use albuterol inhaler as discussed.  Over-the-counter Mucinex during the day.  Follow up with your primary care physician this week as needed. Return to Urgent care for new or worsening concerns.

## 2018-08-16 NOTE — ED Triage Notes (Signed)
Pt here for Cold symptoms since last Tuesday. Has been having dry cough, nasal congestion, sore throat from all his coughing and runny nose. No fever reported. Has been taking mucinex and has a hard time sleeping due to the cough and breathing.

## 2018-08-16 NOTE — ED Provider Notes (Signed)
[facility] ____________________________________________  Time seen: Approximately 6:43 PM  I have reviewed the triage vital signs and the nursing notes.   HISTORY  Chief Complaint Nasal Congestion   HPI Richard Greer is a 33 y.o. male presenting for just over 1 week of runny nose, nasal congestion and cough.  Patient states in the last 3 to 4 days he has had increased coughing and the last few days he feels like he can intermittently hear himself wheeze.  History of asthma that intermittently flares up when he is sick.  Has occasionally use his home albuterol inhaler which does help.  States cough is occasionally productive of whitish-greenish mucus.  Nasal congestion as well, denies sinus pain.  States occasional throat irritation with cough, denies sore throat otherwise.  Denies known fevers.  Has continue to remain active.  States he has had some shortness of breath, stating only at night with the increased cough and wheezing.  States that this feels consistent with his previous asthma exacerbations.  Denies any current shortness of breath.  No chest pain.  Denies hemoptysis.  Denies extremity edema.  Denies known direct sick contacts, but does work around a lot of people in the public.  Has also taken some over-the-counter Mucinex without much change.  Denies aggravating alleviating factors.  Denies recent sickness.  Reports otherwise doing well.  Smitty Cords, DO: PCP    Past Medical History:  Diagnosis Date  . Asthma    well-controlled  . GERD (gastroesophageal reflux disease)    h/o    Patient Active Problem List   Diagnosis Date Noted  . Internal and external hemorrhoids without complication 07/03/2018  . Hypercholesterolemia 07/02/2018  . Mild persistent asthma without complication 05/21/2018  . Environmental and seasonal allergies 05/21/2018  . Morbid obesity with BMI of 40.0-44.9, adult (HCC) 05/21/2018  . Carpal tunnel syndrome on right  05/21/2018    Past Surgical History:  Procedure Laterality Date  . NO PAST SURGERIES    . REPAIR EXTENSOR TENDON Right 09/15/2016   Procedure: REPAIR EXTENSOR TENDON RIGHT INDEX FINGER;  Surgeon: Kennedy Bucker, MD;  Location: [facility] ORS;  Service: Orthopedics;  Laterality: Right;     No current facility-administered medications for this encounter.   Current Outpatient Medications:  .  albuterol (PROVENTIL HFA;VENTOLIN HFA) 108 (90 Base) MCG/ACT inhaler, Inhale 2 puffs into the lungs every 4 (four) hours as needed for wheezing or shortness of breath (cough)., Disp: 1 Inhaler, Rfl: 3 .  chlorpheniramine-HYDROcodone (TUSSIONEX PENNKINETIC ER) 10-8 MG/5ML SUER, Take 5 mLs by mouth at bedtime as needed for cough. do not drive or operate machinery while taking as can cause drowsiness., Disp: 50 mL, Rfl: 0 .  doxycycline (VIBRAMYCIN) 100 MG capsule, Take 1 capsule (100 mg total) by mouth 2 (two) times daily., Disp: 20 capsule, Rfl: 0 .  loratadine (CLARITIN) 10 MG tablet, Take 1 tablet (10 mg total) by mouth daily. Use for 4-6 weeks then stop, and use as needed or seasonally, Disp: 30 tablet, Rfl: 11 .  montelukast (SINGULAIR) 10 MG tablet, Take 1 tablet (10 mg total) by mouth at bedtime., Disp: 30 tablet, Rfl: 5 .  predniSONE (DELTASONE) 10 MG tablet, Start 60 mg po day one, then 50 mg po day two, taper by 10 mg daily until complete., Disp: 21 tablet, Rfl: 0  Allergies Patient has no known allergies.  Family History  Problem Relation Age of Onset  . Stroke Father   . Diabetes Father 35  .  Cancer Maternal Uncle        pancreatic  . Cancer Maternal Grandmother        esophageal  . Cancer Maternal Grandfather        pancreatic  . Heart disease Paternal Grandfather   . Colon cancer Neg Hx   . Prostate cancer Neg Hx     Social History Social History   Tobacco Use  . Smoking status: Former Smoker    Packs/day: 0.15    Years: 2.00    Pack years: 0.30    Types: Cigarettes    Last attempt  to quit: 12/16/2017    Years since quitting: 0.6  . Smokeless tobacco: Former Engineer, water Use Topics  . Alcohol use: Yes    Comment: occ  . Drug use: No    Review of Systems Constitutional: No fever ENT: As above.  Cardiovascular: Denies chest pain. Respiratory: As above.  Gastrointestinal: No abdominal pain.  No nausea, no vomiting.  No diarrhea.  Musculoskeletal: Negative for back pain. Skin: Negative for rash.  ____________________________________________   PHYSICAL EXAM:  VITAL SIGNS: ED Triage Vitals  Enc Vitals Group     BP 08/16/18 1612 (!) 151/90     Pulse Rate 08/16/18 1610 86     Resp 08/16/18 1610 18     Temp 08/16/18 1610 98.4 F (36.9 C)     Temp Source 08/16/18 1610 Oral     SpO2 08/16/18 1610 97 %     Weight 08/16/18 1613 300 lb (136.1 kg)     Height 08/16/18 1613 5\' 10"  (1.778 m)     Head Circumference --      Peak Flow --      Pain Score 08/16/18 1612 0     Pain Loc --      Pain Edu? --      Excl. in GC? --     Constitutional: Alert and oriented. Well appearing and in no acute distress. Eyes: Conjunctivae are normal.  Head: Atraumatic. No sinus tenderness to palpation. No swelling. No erythema.  Ears: no erythema, normal TMs bilaterally.   Nose:Nasal congestion   Mouth/Throat: Mucous membranes are moist. No pharyngeal erythema. No tonsillar swelling or exudate.  Neck: No stridor.  No cervical spine tenderness to palpation. Hematological/Lymphatic/Immunilogical: No cervical lymphadenopathy. Cardiovascular: Normal rate, regular rhythm. Grossly normal heart sounds.  Good peripheral circulation. Respiratory: Normal respiratory effort.  No retractions.  No rhonchi.  Minimal scattered wheezes.  Dry intermittent cough in room with bronchospasm.  Speaks in complete sentences.  Good air movement.  Musculoskeletal: Ambulatory with steady gait.  Lower extremity edema noted bilaterally. Neurologic:  Normal speech and language. No gait instability. Skin:   Skin appears warm, dry and intact. No rash noted. Psychiatric: Mood and affect are normal. Speech and behavior are normal. ___________________________________________   LABS (all labs ordered are listed, but only abnormal results are displayed)  Labs Reviewed - No data to display ____________________________________________  PROCEDURES Procedures     INITIAL IMPRESSION / ASSESSMENT AND PLAN / ED COURSE  Pertinent labs & imaging results that were available during my care of the patient were reviewed by me and considered in my medical decision making (see chart for details).  Well-appearing patient. No acute distress.  Suspect recent viral upper respiratory infection, with current asthma exacerbation.  Recommend continue home albuterol inhaler as needed, will treat with prednisone taper, doxycycline empirically as symptoms have continued for over a week, and PRN Tussionex at night. Patient still take  over-the-counter cough medication during the day. Encourage rest, fluids, supportive care.  Discussed follow-up and return parameters. Discussed indication, risks and benefits of medications with patient.  Discussed follow up with Primary care physician this week. Discussed follow up and return parameters including no resolution or any worsening concerns. Patient verbalized understanding and agreed to plan.   ____________________________________________   FINAL CLINICAL IMPRESSION(S) / ED DIAGNOSES  Final diagnoses:  Viral URI with cough  Intermittent asthma with acute exacerbation, unspecified asthma severity     ED Discharge Orders         Ordered    predniSONE (DELTASONE) 10 MG tablet     08/16/18 1745    doxycycline (VIBRAMYCIN) 100 MG capsule  2 times daily     08/16/18 1745    chlorpheniramine-HYDROcodone (TUSSIONEX PENNKINETIC ER) 10-8 MG/5ML SUER  At bedtime PRN     08/16/18 1745           Note: This dictation was prepared with Dragon dictation along with smaller  phrase technology. Any transcriptional errors that result from this process are unintentional.         Renford DillsMiller, Johncarlo Maalouf, NP 08/16/18 [phone]

## 2018-09-12 ENCOUNTER — Ambulatory Visit (INDEPENDENT_AMBULATORY_CARE_PROVIDER_SITE_OTHER): Payer: BLUE CROSS/BLUE SHIELD | Admitting: Family Medicine

## 2018-09-12 ENCOUNTER — Encounter: Payer: Self-pay | Admitting: Family Medicine

## 2018-09-12 VITALS — BP 122/59 | HR 80 | Temp 98.1°F | Resp 16 | Ht 70.0 in | Wt 342.0 lb

## 2018-09-12 DIAGNOSIS — J4540 Moderate persistent asthma, uncomplicated: Secondary | ICD-10-CM

## 2018-09-12 DIAGNOSIS — R600 Localized edema: Secondary | ICD-10-CM | POA: Diagnosis not present

## 2018-09-12 DIAGNOSIS — Z6841 Body Mass Index (BMI) 40.0 and over, adult: Secondary | ICD-10-CM | POA: Diagnosis not present

## 2018-09-12 MED ORDER — FLUTICASONE-SALMETEROL 250-50 MCG/DOSE IN AEPB
1.0000 | INHALATION_SPRAY | Freq: Two times a day (BID) | RESPIRATORY_TRACT | 2 refills | Status: DC
Start: 2018-09-12 — End: 2020-09-01

## 2018-09-12 MED ORDER — FUROSEMIDE 20 MG PO TABS: 20.0000 mg | ORAL_TABLET | Freq: Every day | ORAL | 0 refills | Status: DC | PRN

## 2018-09-12 NOTE — Patient Instructions (Addendum)
Thank you for coming to the office today.  For swelling Unlikely to be DVT or blood clot. Unlikely to be heart or kidney related. Most likely excess fluid swelling due to prolong time on feet, temperature, and hydration level, try to improve hydration limit salt intake overall. Also can be related to vein blood flow not pumping back as good as it should  Use RICE therapy: - R - Rest / relative rest with activity modification avoid overuse of joint - I - Ice packs (make sure you use a towel or sock / something to protect skin) - C - Compression with ACE wrap to apply pressure and reduce swelling allowing more support - evening only or few hours at end of shift at work. - E - Elevation - if significant swelling, lift leg above heart level (toes above your nose) to help reduce swelling, most helpful at night after day of being on your feet  Trial on Furosemide lasix fluid pill 20mg  - take one a day for up to 3 days max per use - ONLY if significant swelling, notify me if need to use too often  For asthma  Continue singulair START Advair inhaler 1 puff twice a day - maintenance Keep using albuterol as needed, then taper down and limit use of albuterol   Please schedule a Follow-up Appointment to: Return in about 3 months (around 12/11/2018) for Asthma, Edema.  If you have any other questions or concerns, please feel free to call the office or send a message through MyChart. You may also schedule an earlier appointment if necessary.  Additionally, you may be receiving a survey about your experience at our office within a few days to 1 week by e-mail or mail. We value your feedback.  Saralyn Pilar, DO [facility], [place]

## 2018-09-12 NOTE — Assessment & Plan Note (Signed)
Recent flare up acute asthma 1/30, has environmental and URI triggers, also some exercise induced / environmental triggers - Improved on singulair but not adequate control, high frequency of albuterol rescue daily use now, and prior treatment with flare < 1 month ago Currently stable without active exacerbation Long history of asthma, environmental/allergy triggers / URI  Plan ADD Advair 250/50 diskus 1 puff BID, new rx written, demo in office Continue Singulair 10mg  nightly (try to take more regularly vs PRN when needed) as discussed for both asthma / allergy Continue Claritin 10mg  daily PRN ONLY Continue Albuterol 2 puffs q 4-6 hour PRN - limit use of albuterol now is goal  Defer treat acute exac again, no wheezing on exam, may call office within 1 week if worse and may need extend prednisone course  Future if ineffective on ICS/LABA, and Singulair next option would consider refer to Asthma / Allergy specialist  Follow-up

## 2018-09-12 NOTE — Assessment & Plan Note (Signed)
Significant weight gain in past 2 months, some attributed to fluid weight with leg edema Encourage improve regular activity exercise Limit sodium in diet, improve low carb options

## 2018-09-12 NOTE — Progress Notes (Signed)
Subjective:    Patient ID: Richard Greer, male    DOB: 08-02-1985, 33 y.o.   MRN: [id number]  Richard Greer is a 33 y.o. male presenting on 09/12/2018 for Joint Swelling (right ankle onset 5 days)   HPI   BILATERAL LOWER EXT EDEMA / Morbid Obesity BMI >49 Reports symptoms started about 4 days ago with bilateral ankle swelling R>L worse with prolonged standing at work long shift standing for many hours, improved some with elevation at night and some soaking it improves, significantly improved and then worse again each day. - He had one episode 1 year ago in summer with ankle swelling from working long shift and then it resolved, otherwise no significant history in past of ankle injury or other problem - No history of DVT. Long distance travel about 1 month ago flew to [place] and drove back. - Denies any pain, redness, ulceration or break in skin  Moderate Persistent Asthma without exacerbation / FOLLOW-UP BRONCHITIS Recent course, 1/30 treated at [facility] for bronchitis / asthma exac, with doxycycline, albuterol PRN, prednisone burst, and cough syrup. He improved significantly but still seems he has some residual chest tightness and cough, worse with exertion, non productive, feels more like his asthma or wheezing, he uses albuterol still now about 3 times a day with good results, but feels he is using it too often. - Previously in 05/2018 we started him on singulair nightly for outdoor environmental allergy and asthma maintenance, this has been helpful to him but not controlling his asthma - Denies night time awakening but does feel he can feel some worsening breathing at night   Depression screen [facility] 2/9 09/12/2018 07/03/2018 05/21/2018  Decreased Interest 0 0 0  Down, Depressed, Hopeless 0 0 0  PHQ - 2 Score 0 0 0    Social History   Tobacco Use  . Smoking status: Former Smoker    Packs/day: 0.15    Years: 2.00    Pack years: 0.30    Types: Cigarettes    Last  attempt to quit: 12/16/2017    Years since quitting: 0.7  . Smokeless tobacco: Former Neurosurgeon  . Tobacco comment: quit 9 months  Substance Use Topics  . Alcohol use: Yes    Comment: occ  . Drug use: No    Review of Systems Per HPI unless specifically indicated above     Objective:    BP (!) 122/59   Pulse 80   Temp 98.1 F (36.7 C) (Oral)   Resp 16   Ht  (1.778 m)   Wt (!) 342 lb (155.1 kg)   SpO2 98%   BMI 49.07 kg/m   Wt Readings from Last 3 Encounters:  09/12/18 (!) 342 lb (155.1 kg)  08/16/18 300 lb (136.1 kg)  07/03/18 (!) 317 lb (143.8 kg)    Physical Exam Vitals signs and nursing note reviewed.  Constitutional:      General: He is not in acute distress.    Appearance: He is well-developed. He is not diaphoretic.     Comments: Well-appearing, comfortable, cooperative, obese  HENT:     Head: Normocephalic and atraumatic.  Eyes:     General:        Right eye: No discharge.        Left eye: No discharge.     Conjunctiva/sclera: Conjunctivae normal.  Cardiovascular:     Rate and Rhythm: Normal rate and regular rhythm.     Heart sounds: NormaJAMAURIE BERNIERheart sounds.  No murmur.  Pulmonary:     Effort: Pulmonary effort is normal. No respiratory distress.     Breath sounds: No stridor. No wheezing.     Comments: Slightly reduced air movement diffusely, no obvious wheezing or coarse breath sounds. But possibly some tight expiratory sounds. No coughing. Musculoskeletal:        General: Swelling present. No tenderness.     Right lower leg: Edema (non pitting +1-2 edema ankle to lower leg) present.     Left lower leg: Edema (non pitting +1-2 edema ankle to lower leg) present.     Comments: No erythema, no asymmetry, non tender, full range of motion.  Skin:    General: Skin is warm and dry.     Findings: No erythema or rash.  Neurological:     Mental Status: He is alert and oriented to person, place, and time.  Psychiatric:        Behavior: Behavior normal.      Comments: Well groomed, good eye contact, normal speech and thoughts    Results for orders placed or performed in visit on 06/26/18  HIV Antibody (routine testing w rflx)  Result Value Ref Range   HIV 1&2 Ab, 4th Generation NON-REACTIVE NON-REACTI  Lipid panel  Result Value Ref Range   Cholesterol 189 <200 mg/dL   HDL 40 (L) >26 mg/dL   Triglycerides 203 <559 mg/dL   LDL Cholesterol (Calc) 127 (H) mg/dL (calc)   Total CHOL/HDL Ratio 4.7 <5.0 (calc)   Non-HDL Cholesterol (Calc) 149 (H) <130 mg/dL (calc)  COMPLETE METABOLIC PANEL WITH GFR  Result Value Ref Range   Glucose, Bld 93 65 - 99 mg/dL   BUN 13 7 - 25 mg/dL   Creat 7.41 6.38 - 4.53 mg/dL   GFR, Est Non African American 119 > OR = 60 mL/min/1.68m2   GFR, Est African American 138 > OR = 60 mL/min/1.38m2   BUN/Creatinine Ratio NOT APPLICABLE 6 - 22 (calc)   Sodium 140 135 - 146 mmol/L   Potassium 4.0 3.5 - 5.3 mmol/L   Chloride 102 98 - 110 mmol/L   CO2 29 20 - 32 mmol/L   Calcium 8.9 8.6 - 10.3 mg/dL   Total Protein 6.7 6.1 - 8.1 g/dL   Albumin 4.3 3.6 - 5.1 g/dL   Globulin 2.4 1.9 - 3.7 g/dL (calc)   AG Ratio 1.8 1.0 - 2.5 (calc)   Total Bilirubin 0.6 0.2 - 1.2 mg/dL   Alkaline phosphatase (APISO) 95 40 - 115 U/L   AST 17 10 - 40 U/L   ALT 26 9 - 46 U/L  CBC with Differential/Platelet  Result Value Ref Range   WBC 7.4 3.8 - 10.8 Thousand/uL   RBC 5.32 4.20 - 5.80 Million/uL   Hemoglobin 15.1 13.2 - 17.1 g/dL   HCT 64.6 80.3 - 21.2 %   MCV 85.2 80.0 - 100.0 fL   MCH 28.4 27.0 - 33.0 pg   MCHC 33.3 32.0 - 36.0 g/dL   RDW 24.8 25.0 - 03.7 %   Platelets 245 140 - 400 Thousand/uL   MPV 10.1 7.5 - 12.5 fL   Neutro Abs 5,358 1,500 - 7,800 cells/uL   Lymphs Abs 1,162 850 - 3,900 cells/uL   WBC mixed population 681 200 - 950 cells/uL   Eosinophils Absolute 170 15 - 500 cells/uL   Basophils Absolute 30 0 - 200 cells/uL   Neutrophils Relative % 72.4 %   Total Lymphocyte 15.7 %   Monocytes Relative 9.2 %  Eosinophils  Relative 2.3 %   Basophils Relative 0.4 %  Hemoglobin A1c  Result Value Ref Range   Hgb A1c MFr Bld 5.4 <5.7 % of total Hgb   Mean Plasma Glucose 108 (calc)   eAG (mmol/L) 6.0 (calc)      Assessment & Plan:   Problem List Items Addressed This Visit    Moderate persistent asthma without complication - Primary    Recent flare up acute asthma 1/30, has environmental and URI triggers, also some exercise induced / environmental triggers - Improved on singulair but not adequate control, high frequency of albuterol rescue daily use now, and prior treatment with flare < 1 month ago Currently stable without active exacerbation Long history of asthma, environmental/allergy triggers / URI  Plan ADD Advair 250/50 diskus 1 puff BID, new rx written, demo in office Continue Singulair 10mg  nightly (try to take more regularly vs PRN when needed) as discussed for both asthma / allergy Continue Claritin 10mg  daily PRN ONLY Continue Albuterol 2 puffs q 4-6 hour PRN - limit use of albuterol now is goal  Defer treat acute exac again, no wheezing on exam, may call office within 1 week if worse and may need extend prednisone course  Future if ineffective on ICS/LABA, and Singulair next option would consider refer to Asthma / Allergy specialist  Follow-up      Relevant Medications   Fluticasone-Salmeterol (ADVAIR DISKUS) 250-50 MCG/DOSE AEPB   Morbid obesity with BMI of 40.0-44.9, adult (HCC)    Significant weight gain in past 2 months, some attributed to fluid weight with leg edema Encourage improve regular activity exercise Limit sodium in diet, improve low carb options       Other Visit Diagnoses    Bilateral lower extremity edema       Relevant Medications   furosemide (LASIX) 20 MG tablet      Clinically with bilateral LE edema mostly foot/ankle symmetrical and involving lower leg, no significant pitting evident. No erythema or tenderness or laceration or skin break. - LIkely provoked by  prolong standing work conditions, poor hydration factors - Improved w/ rest and elevation - Recent labs 06/2018 reviewed, based on exam and history less likely to be cardiac CHF, but cannot entirely rule out today. No evidence of CKD. No exam findings to suggest more peripheral vascular disease. - No sign of DVT on exam, Well's score 0 (low risk) - Probably some venous stasis  Also probably worse swelling recently from prednisone.  Plan - Trial on low dose diuretic furosemide 20mg  daily PRN 1-3 days only then stop and may repeat again, limit use to 1-2 time a month max, notify if using too often - RICE therapy per AVS, ace wrap, elevation ice - Improve hydration - Future if not improving, reconsider work-up consider ECHO + BNP / venous doppler  Meds ordered this encounter  Medications  . furosemide (LASIX) 20 MG tablet    Sig: Take 1 tablet (20 mg total) by mouth daily as needed for edema. Take up to 1-3 days per use, and then stop    Dispense:  30 tablet    Refill:  0  . Fluticasone-Salmeterol (ADVAIR DISKUS) 250-50 MCG/DOSE AEPB    Sig: Inhale 1 puff into the lungs 2 (two) times daily.    Dispense:  60 each    Refill:  2    Follow up plan: Return in about 3 months (around 12/11/2018) for Asthma, Edema.   Saralyn PilarAlexander Zoa Dowty, DO [facility] 09/12/2018, 10:04 AM

## 2019-06-28 ENCOUNTER — Other Ambulatory Visit: Payer: BLUE CROSS/BLUE SHIELD

## 2019-06-28 DIAGNOSIS — K648 Other hemorrhoids: Secondary | ICD-10-CM

## 2019-06-28 DIAGNOSIS — K644 Residual hemorrhoidal skin tags: Secondary | ICD-10-CM

## 2019-06-28 DIAGNOSIS — E7800 Pure hypercholesterolemia, unspecified: Secondary | ICD-10-CM

## 2019-06-28 DIAGNOSIS — Z0000 Encounter for general adult medical examination without abnormal findings: Secondary | ICD-10-CM

## 2019-06-29 LAB — LIPID PANEL
Cholesterol: 167 mg/dL (ref <200)
HDL: 36 mg/dL — ABNORMAL LOW (ref >=40)
LDL Cholesterol (Calc): 112 mg/dL (calc) — ABNORMAL HIGH
Non-HDL Cholesterol (Calc): 131 mg/dL (calc) — ABNORMAL HIGH (ref <130)
Total CHOL/HDL Ratio: 4.6 (calc) (ref <5.0)
Triglycerides: 89 mg/dL (ref <150)

## 2019-06-29 LAB — COMPLETE METABOLIC PANEL WITH GFR
AG Ratio: 2.1 (calc) (ref 1.0–2.5)
ALT: 30 U/L (ref 9–46)
AST: 17 U/L (ref 10–40)
Albumin: 4.1 g/dL (ref 3.6–5.1)
Alkaline phosphatase (APISO): 109 U/L (ref 36–130)
BUN: 16 mg/dL (ref 7–25)
CO2: 24 mmol/L (ref 20–32)
Calcium: 8.5 mg/dL — ABNORMAL LOW (ref 8.6–10.3)
Chloride: 105 mmol/L (ref 98–110)
Creat: 0.78 mg/dL (ref 0.60–1.35)
GFR, Est African American: 138 mL/min/{1.73_m2} (ref >=60)
GFR, Est Non African American: 119 mL/min/{1.73_m2} (ref >=60)
Globulin: 2 g/dL (calc) (ref 1.9–3.7)
Glucose, Bld: 109 mg/dL — ABNORMAL HIGH (ref 65–99)
Potassium: 4 mmol/L (ref 3.5–5.3)
Sodium: 140 mmol/L (ref 135–146)
Total Bilirubin: 0.4 mg/dL (ref 0.2–1.2)
Total Protein: 6.1 g/dL (ref 6.1–8.1)

## 2019-06-29 LAB — CBC WITH DIFFERENTIAL/PLATELET
Absolute Monocytes: 808 {cells}/uL (ref 200–950)
Basophils Absolute: 43 {cells}/uL (ref 0–200)
Basophils Relative: 0.5 %
Eosinophils Absolute: 163 {cells}/uL (ref 15–500)
Eosinophils Relative: 1.9 %
HCT: 44.3 % (ref 38.5–50.0)
Hemoglobin: 15.1 g/dL (ref 13.2–17.1)
Lymphs Abs: 998 {cells}/uL (ref 850–3900)
MCH: 29 pg (ref 27.0–33.0)
MCHC: 34.1 g/dL (ref 32.0–36.0)
MCV: 85 fL (ref 80.0–100.0)
MPV: 10.4 fL (ref 7.5–12.5)
Monocytes Relative: 9.4 %
Neutro Abs: 6588 {cells}/uL (ref 1500–7800)
Neutrophils Relative %: 76.6 %
Platelets: 212 Thousand/uL (ref 140–400)
RBC: 5.21 Million/uL (ref 4.20–5.80)
RDW: 13.1 % (ref 11.0–15.0)
Total Lymphocyte: 11.6 %
WBC: 8.6 Thousand/uL (ref 3.8–10.8)

## 2019-06-29 LAB — HEMOGLOBIN A1C
Hgb A1c MFr Bld: 5.5 % of total Hgb (ref <5.7)
Mean Plasma Glucose: 111 (calc)
eAG (mmol/L): 6.2 (calc)

## 2019-06-29 LAB — TSH: TSH: 3.34 mIU/L (ref 0.40–4.50)

## 2019-07-03 ENCOUNTER — Encounter: Payer: Self-pay | Admitting: Family Medicine

## 2019-07-03 ENCOUNTER — Other Ambulatory Visit: Payer: Self-pay

## 2019-07-03 ENCOUNTER — Ambulatory Visit (INDEPENDENT_AMBULATORY_CARE_PROVIDER_SITE_OTHER): Payer: BLUE CROSS/BLUE SHIELD | Admitting: Family Medicine

## 2019-07-03 VITALS — BP 127/69 | HR 83 | Temp 98.2°F | Resp 16 | Ht 70.0 in | Wt 343.0 lb

## 2019-07-03 DIAGNOSIS — Z0000 Encounter for general adult medical examination without abnormal findings: Secondary | ICD-10-CM | POA: Diagnosis not present

## 2019-07-03 DIAGNOSIS — E7800 Pure hypercholesterolemia, unspecified: Secondary | ICD-10-CM

## 2019-07-03 DIAGNOSIS — J4540 Moderate persistent asthma, uncomplicated: Secondary | ICD-10-CM | POA: Diagnosis not present

## 2019-07-03 DIAGNOSIS — J3089 Other allergic rhinitis: Secondary | ICD-10-CM

## 2019-07-03 DIAGNOSIS — Z6841 Body Mass Index (BMI) 40.0 and over, adult: Secondary | ICD-10-CM

## 2019-07-03 NOTE — Patient Instructions (Addendum)
Thank you for coming to the office today.  1. Chemistry - Normal results, including electrolytes, kidney and liver function. Normal fasting blood sugar   2. Hemoglobin A1c (Diabetes screening) - 5.5, normal not in range of Pre-Diabetes (>5.7 to 6.4)   3. Routine screening TSH thyroid  4. Cholesterol - Mild elevated LDL "bad cholesterol" 112 but it is improved overall, goal is < 130.   5. CBC Blood Counts - Normal, no anemia, other abnormality  DUE for FASTING BLOOD WORK (no food or drink after midnight before the lab appointment, only water or coffee without cream/sugar on the morning of)  SCHEDULE "Lab Only" visit in the morning at the clinic for lab draw in 1 YEAR  - Make sure Lab Only appointment is at about 1 week before your next appointment, so that results will be available  For Lab Results, once available within 2-3 days of blood draw, you can can log in to MyChart online to view your results and a brief explanation. Also, we can discuss results at next follow-up visit.   Please schedule a Follow-up Appointment to: Return in about 1 year (around 07/02/2020) for Annual Physical.  If you have any other questions or concerns, please feel free to call the office or send a message through [place]. You may also schedule an earlier appointment if necessary.  Additionally, you may be receiving a survey about your experience at our office within a few days to 1 week by e-mail or mail. We value your feedback.  Nobie Putnam, DO [place]

## 2019-07-03 NOTE — Progress Notes (Signed)
Subjective:    Patient ID: Richard Greer, male    DOB: 05/28/1986, 33 y.o.   MRN: [id number]  Richard Greer is a 33 y.o. male presenting on 07/03/2019 for Annual Exam   HPI  Here for Annual Physical and Lab Review  Asthma, mild persistent / Environmental Seasonal Allergies Not taking Albuterol PRN at this time. No longer needing it - Using Advair rarely, intermittent at this time, no longer needed regularly  - Off Claritin, Singulair now, they were helpful in past. But he may request again in future. - Today patient reports he is doing well, less often having any asthma flare or breathing symptoms, seems allergies are better under control - No recent flare up  LIFESTYLE / HYPERLIPIDEMIA / Morbid Obesity BMI >49 - Reports no concerns. Last lipid panel 06/2019, controlled mostly but slight elevated LDL still reduced from last time Not taking cholesterol med Lifestyle - Wt fluctuated down 30 lbs, then regained - Diet:reduced carbs, improved water intake, reduced sodas, and Improved ankle edema - Exercise: active at work outdoor farmers market  History ofTobacco / Nicotine Abuse He remains smoke/vape free currently quit about 5 months ago 12/2017. Overall only smoked for about 2 years approx small infrequent amount cigarettes tried to quit with vaping and then used for a while.   Health Maintenance: Due for Flu Shot, declines today despite counseling on benefits  UTD Routine HIV screen  UTD TDap 2018  Depression screen [facility] 2/9 07/03/2019 09/12/2018 07/03/2018  Decreased Interest 0 0 0  Down, Depressed, Hopeless 0 0 0  PHQ - 2 Score 0 0 0    Past Medical History:  Diagnosis Date  . Asthma    well-controlled  . GERD (gastroesophageal reflux disease)    h/o   Past Surgical History:  Procedure Laterality Date  . NO PAST SURGERIES    . REPAIR EXTENSOR TENDON Right 09/15/2016   Procedure: REPAIR EXTENSOR TENDON RIGHT INDEX FINGER;  Surgeon: Hessie Knows, MD;   Location: [facility] ORS;  Service: Orthopedics;  Laterality: Right;   Social History   Socioeconomic History  . Marital status: Single    Spouse name: Not on file  . Number of children: Not on file  . Years of education: [employer]  . Highest education level: High school graduate  Occupational History  . Occupation: Personal assistant    Comment: [employer]  Tobacco Use  . Smoking status: Former Smoker    Packs/day: 0.15    Years: 2.00    Pack years: 0.30    Types: Cigarettes    Quit date: 12/16/2017    Years since quitting: 1.5  . Smokeless tobacco: Former Systems developer  . Tobacco comment: quit 9 months  Substance and Sexual Activity  . Alcohol use: Yes    Comment: occ  . Drug use: No  . Sexual activity: Not on file  Other Topics Concern  . Not on file  Social History Narrative  . Not on file   Social Determinants of Health   Financial Resource Strain:   . Difficulty of Paying Living Expenses: Not on file  Food Insecurity:   . Worried About Charity fundraiser in the Last Year: Not on file  . Ran Out of Food in the Last Year: Not on file  Transportation Needs:   . Lack of Transportation (Medical): Not on file  . Lack of Transportation (Non-Medical): Not on file  Physical Activity:   . Days of Exercise per Week: Not on file  .  Minutes of Exercise per Session: Not on file  Stress:   . Feeling of Stress : Not on file  Social Connections:   . Frequency of Communication with Friends and Family: Not on file  . Frequency of Social Gatherings with Friends and Family: Not on file  . Attends Religious Services: Not on file  . Active Member of Clubs or Organizations: Not on file  . Attends Banker Meetings: Not on file  . Marital Status: Not on file  Intimate Partner Violence:   . Fear of Current or Ex-Partner: Not on file  . Emotionally Abused: Not on file  . Physically Abused: Not on file  . Sexually Abused: Not on file   Family History  Problem Relation Age of Onset  .  Stroke Father   . Diabetes Father 74  . Cancer Maternal Uncle        pancreatic  . Cancer Maternal Grandmother        esophageal  . Cancer Maternal Grandfather        pancreatic  . Heart disease Paternal Grandfather   . Colon cancer Neg Hx   . Prostate cancer Neg Hx    Current Outpatient Medications on File Prior to Visit  Medication Sig  . albuterol (PROVENTIL HFA;VENTOLIN HFA) 108 (90 Base) MCG/ACT inhaler Inhale 2 puffs into the lungs every 4 (four) hours as needed for wheezing or shortness of breath (cough).  . Fluticasone-Salmeterol (ADVAIR DISKUS) 250-50 MCG/DOSE AEPB Inhale 1 puff into the lungs 2 (two) times daily.   No current facility-administered medications on file prior to visit.    Review of Systems  Constitutional: Negative for activity change, appetite change, chills, diaphoresis, fatigue and fever.  HENT: Negative for congestion and hearing loss.   Eyes: Negative for visual disturbance.  Respiratory: Negative for apnea, cough, chest tightness, shortness of breath and wheezing.   Cardiovascular: Negative for chest pain, palpitations and leg swelling.  Gastrointestinal: Negative for abdominal pain, anal bleeding, blood in stool, constipation, diarrhea, nausea and vomiting.  Endocrine: Negative for cold intolerance.  Genitourinary: Negative for decreased urine volume, difficulty urinating, dysuria, frequency, hematuria and urgency.  Musculoskeletal: Negative for arthralgias, back pain and neck pain.  Skin: Negative for rash.  Allergic/Immunologic: Negative for environmental allergies.  Neurological: Negative for dizziness, weakness, light-headedness, numbness and headaches.  Hematological: Negative for adenopathy.  Psychiatric/Behavioral: Negative for behavioral problems, dysphoric mood and sleep disturbance. The patient is not nervous/anxious.    Per HPI unless specifically indicated above      Objective:    BP 127/69   Pulse 83   Temp 98.2 F (36.8 C)  (Oral)   Resp 16   Ht 5\' 10"  (1.778 m)   Wt (!) 343 lb (155.6 kg)   BMI 49.22 kg/m   Wt Readings from Last 3 Encounters:  07/03/19 (!) 343 lb (155.6 kg)  09/12/18 (!) 342 lb (155.1 kg)  08/16/18 300 lb (136.1 kg)    Physical Exam Vitals and nursing note reviewed.  Constitutional:      General: He is not in acute distress.    Appearance: He is well-developed. He is not diaphoretic.     Comments: Well-appearing, comfortable, cooperative, obese  HENT:     Head: Normocephalic and atraumatic.  Eyes:     General:        Right eye: No discharge.        Left eye: No discharge.     Conjunctiva/sclera: Conjunctivae normal.     Pupils:  Pupils are equal, round, and reactive to light.  Neck:     Thyroid: No thyromegaly.     Comments: No carotid bruits Cardiovascular:     Rate and Rhythm: Normal rate and regular rhythm.     Heart sounds: Normal heart sounds. No murmur.  Pulmonary:     Effort: Pulmonary effort is normal. No respiratory distress.     Breath sounds: Normal breath sounds. No wheezing or rales.  Abdominal:     General: Bowel sounds are normal. There is no distension.     Palpations: Abdomen is soft. There is no mass.     Tenderness: There is no abdominal tenderness.  Musculoskeletal:        General: No tenderness. Normal range of motion.     Cervical back: Normal range of motion and neck supple.     Right lower leg: No edema (improved).     Left lower leg: No edema (improved).     Comments: Upper / Lower Extremities: - Normal muscle tone, strength bilateral upper extremities 5/5, lower extremities 5/5  Lymphadenopathy:     Cervical: No cervical adenopathy.  Skin:    General: Skin is warm and dry.     Findings: No erythema or rash.  Neurological:     Mental Status: He is alert and oriented to person, place, and time.     Comments: Distal sensation intact to light touch all extremities  Psychiatric:        Behavior: Behavior normal.     Comments: Well groomed, good  eye contact, normal speech and thoughts        Results for orders placed or performed in visit on 06/28/19  TSH  Result Value Ref Range   TSH 3.34 0.40 - 4.50 mIU/L  Lipid panel  Result Value Ref Range   Cholesterol 167 <200 mg/dL   HDL 36 (L) > OR = 40 mg/dL   Triglycerides 89 <409 mg/dL   LDL Cholesterol (Calc) 112 (H) mg/dL (calc)   Total CHOL/HDL Ratio 4.6 <5.0 (calc)   Non-HDL Cholesterol (Calc) 131 (H) <130 mg/dL (calc)  COMPLETE METABOLIC PANEL WITH GFR  Result Value Ref Range   Glucose, Bld 109 (H) 65 - 99 mg/dL   BUN 16 7 - 25 mg/dL   Creat 8.11 9.14 - 7.82 mg/dL   GFR, Est Non African American 119 > OR = 60 mL/min/1.13m2   GFR, Est African American 138 > OR = 60 mL/min/1.31m2   BUN/Creatinine Ratio NOT APPLICABLE 6 - 22 (calc)   Sodium 140 135 - 146 mmol/L   Potassium 4.0 3.5 - 5.3 mmol/L   Chloride 105 98 - 110 mmol/L   CO2 24 20 - 32 mmol/L   Calcium 8.5 (L) 8.6 - 10.3 mg/dL   Total Protein 6.1 6.1 - 8.1 g/dL   Albumin 4.1 3.6 - 5.1 g/dL   Globulin 2.0 1.9 - 3.7 g/dL (calc)   AG Ratio 2.1 1.0 - 2.5 (calc)   Total Bilirubin 0.4 0.2 - 1.2 mg/dL   Alkaline phosphatase (APISO) 109 36 - 130 U/L   AST 17 10 - 40 U/L   ALT 30 9 - 46 U/L  CBC with Differential/Platelet  Result Value Ref Range   WBC 8.6 3.8 - 10.8 Thousand/uL   RBC 5.21 4.20 - 5.80 Million/uL   Hemoglobin 15.1 13.2 - 17.1 g/dL   HCT 95.6 21.3 - 08.6 %   MCV 85.0 80.0 - 100.0 fL   MCH 29.0 27.0 - 33.0 pg  MCHC 34.1 32.0 - 36.0 g/dL   RDW 09.813.1 11.911.0 - 14.715.0 %   Platelets 212 140 - 400 Thousand/uL   MPV 10.4 7.5 - 12.5 fL   Neutro Abs 6,588 1,500 - 7,800 cells/uL   Lymphs Abs 998 850 - 3,900 cells/uL   Absolute Monocytes 808 200 - 950 cells/uL   Eosinophils Absolute 163 15 - 500 cells/uL   Basophils Absolute 43 0 - 200 cells/uL   Neutrophils Relative % 76.6 %   Total Lymphocyte 11.6 %   Monocytes Relative 9.4 %   Eosinophils Relative 1.9 %   Basophils Relative 0.5 %  Hemoglobin A1c  Result  Value Ref Range   Hgb A1c MFr Bld 5.5 <5.7 % of total Hgb   Mean Plasma Glucose 111 (calc)   eAG (mmol/L) 6.2 (calc)      Assessment & Plan:   Problem List Items Addressed This Visit    Morbid obesity with BMI of 45.0-49.9, adult (HCC)    Improved weight loss then gain again Encourage lifestyle diet exercise regimen      Moderate persistent asthma without complication    Improved Off asthma maintenance and allergy therapy Follow up if need to restart therapy      Hypercholesterolemia    Mostly controlled cholesterol on improving lifestyle Last lipid panel 06/2019 Calculated ASCVD 10 yr risk score = low risk  Plan: 1. Encourage improved lifestyle - low carb/cholesterol, reduce portion size, continue improving regular exercise Follow-up yearly lipid      Environmental and seasonal allergies    Other Visit Diagnoses    Annual physical exam    -  Primary      Updated Health Maintenance information Reviewed recent lab results with patient Encouraged improvement to lifestyle with diet and exercise - Goal of weight loss   No orders of the defined types were placed in this encounter.    Follow up plan: Return in about 1 year (around 07/02/2020) for Annual Physical.  Future labs 1 year 2021  Saralyn PilarAlexander Johaan Ryser, DO [facility] 07/03/2019, 9:58 AM

## 2019-07-04 ENCOUNTER — Other Ambulatory Visit: Payer: Self-pay | Admitting: Family Medicine

## 2019-07-04 DIAGNOSIS — E7800 Pure hypercholesterolemia, unspecified: Secondary | ICD-10-CM

## 2019-07-04 DIAGNOSIS — Z6841 Body Mass Index (BMI) 40.0 and over, adult: Secondary | ICD-10-CM

## 2019-07-04 DIAGNOSIS — R7309 Other abnormal glucose: Secondary | ICD-10-CM

## 2019-07-04 DIAGNOSIS — Z0000 Encounter for general adult medical examination without abnormal findings: Secondary | ICD-10-CM

## 2019-07-04 NOTE — Assessment & Plan Note (Signed)
Mostly controlled cholesterol on improving lifestyle Last lipid panel 06/2019 Calculated ASCVD 10 yr risk score = low risk  Plan: 1. Encourage improved lifestyle - low carb/cholesterol, reduce portion size, continue improving regular exercise Follow-up yearly lipid

## 2019-07-04 NOTE — Assessment & Plan Note (Signed)
Improved weight loss then gain again Encourage lifestyle diet exercise regimen

## 2019-07-04 NOTE — Assessment & Plan Note (Signed)
Improved Off asthma maintenance and allergy therapy Follow up if need to restart therapy

## 2019-07-05 ENCOUNTER — Encounter: Payer: BLUE CROSS/BLUE SHIELD | Admitting: Family Medicine

## 2020-09-01 ENCOUNTER — Other Ambulatory Visit: Payer: Self-pay

## 2020-09-01 ENCOUNTER — Encounter: Payer: Self-pay | Admitting: Family Medicine

## 2020-09-01 ENCOUNTER — Ambulatory Visit: Payer: BLUE CROSS/BLUE SHIELD | Admitting: Family Medicine

## 2020-09-01 VITALS — BP 139/88 | HR 78 | Temp 97.8°F | Ht 70.0 in | Wt 340.5 lb

## 2020-09-01 DIAGNOSIS — R600 Localized edema: Secondary | ICD-10-CM

## 2020-09-01 DIAGNOSIS — Z6841 Body Mass Index (BMI) 40.0 and over, adult: Secondary | ICD-10-CM

## 2020-09-01 DIAGNOSIS — J4540 Moderate persistent asthma, uncomplicated: Secondary | ICD-10-CM

## 2020-09-01 MED ORDER — MONTELUKAST SODIUM 10 MG PO TABS: 10.0000 mg | ORAL_TABLET | Freq: Every day | ORAL | 3 refills | Status: DC

## 2020-09-01 MED ORDER — FUROSEMIDE 20 MG PO TABS: 20.0000 mg | ORAL_TABLET | Freq: Every day | ORAL | 2 refills | Status: DC | PRN

## 2020-09-01 NOTE — Patient Instructions (Addendum)
Thank you for coming to the office today.  Use RICE therapy: - R - Rest / relative rest with activity modification avoid overuse of joint - I - Ice packs (make sure you use a towel or sock / something to protect skin) - C - Compression with flexible sleeve or ACE wrap to apply pressure and reduce swelling allowing more support - E - Elevation - if significant swelling, lift leg above heart level (toes above your nose) to help reduce swelling, most helpful at night after day of being on your feet  Resume Singulair 10mg  nightly for asthma / breathing / allergies - sent to [facility]  Trial on Furosemide fluid pill only if you need it. 4 dollar list.  We can refer to Pulmonology specialist for breathing function test if you get that insurance coverage  Printed a lab order for chemistry when or if you are ready to come back by in AM to check for anything related to the swelling.   Please schedule a Follow-up Appointment to: Return if symptoms worsen or fail to improve.  If you have any other questions or concerns, please feel free to call the office or send a message through MyChart. You may also schedule an earlier appointment if necessary.  Additionally, you may be receiving a survey about your experience at our office within a few days to 1 week by e-mail or mail. We value your feedback.  , DO [facility], [facility]

## 2020-09-01 NOTE — Progress Notes (Signed)
Subjective:    Patient ID: Richard Greer, male    DOB: 07/19/1985, 35 y.o.   MRN: [id number]  Richard Greer is a 35 y.o. male presenting on 09/01/2020 for Foot Swelling (Pt states is the right foot it started with some tingling a week ago and as of now no more tingling but he believes its fluid. Has a little of swelling at the moment and it comes and goes. He stays on his feet for long hours while working.)   HPI   No insurance now. Moving to [place] next year.  Right > Left Foot Paresthesia Similar history 08/2018, with lower extremity edema, he was having prolonged standing, at that time, he was treated conservatively RICE therapy Furosemide PRN. He reports 1 week ago approximately, he said feet were hanging out of bed when he woke up, and feet R much worse than L with some tingling . Now much improved. He has occasional swelling with R >L foot but worse in PM after end of day usually not during day and if he is active not much swelling - No rx medication. No regular OTC medication - He admits some dietary - History in past with COVID19 in past month+  Asthma moderate persistent / Dyspnea Previous apartment, mold, now moved out He still has dyspnea. He has had chronic history of asthma. Previously on Advair, no longer on due to cost. He has albuterol PRN usage. He used to be on singulair wants to restart. He has not seen pulm or done breathing test. He admits some tightness in chest with dyspnea at times due to hot environments with poor air circulation  Depression screen [facility] 2/9 07/03/2019 09/12/2018 07/03/2018  Decreased Interest 0 0 0  Down, Depressed, Hopeless 0 0 0  PHQ - 2 Score 0 0 0    Social History   Tobacco Use  . Smoking status: Former Smoker    Packs/day: 0.15    Years: 2.00    Pack years: 0.30    Types: Cigarettes    Quit date: 12/16/2017    Years since quitting: 2.7  . Smokeless tobacco: Former Neurosurgeon  . Tobacco comment: quit 9 months  Vaping Use  .  Vaping Use: Former  . Start date: 08/18/2016  . Substances: Nicotine, Flavoring  Substance Use Topics  . Alcohol use: Yes    Comment: occ  . Drug use: No    Review of Systems Per HPI unless specifically indicated above     Objective:    BP 139/88   Pulse 78   Temp 97.8 F (36.6 C) (Temporal)   Ht 5\' 10"  (1.778 m)   Wt (!) 340 lb 8 oz (154.4 kg)   SpO2 99%   BMI 48.86 kg/m   Wt Readings from Last 3 Encounters:  09/01/20 (!) 340 lb 8 oz (154.4 kg)  07/03/19 (!) 343 lb (155.6 kg)  09/12/18 (!) 342 lb (155.1 kg)    Physical Exam Vitals and nursing note reviewed.  Constitutional:      General: He is not in acute distress.    Appearance: He is well-developed and well-nourished. He is not diaphoretic.     Comments: Well-appearing, comfortable, cooperative  HENT:     Head: Normocephalic and atraumatic.     Mouth/Throat:     Mouth: Oropharynx is clear and moist.  Eyes:     General:        Right eye: No discharge.        Left eye:  No discharge.     Conjunctiva/sclera: Conjunctivae normal.  Neck:     Thyroid: No thyromegaly.  Cardiovascular:     Rate and Rhythm: Normal rate and regular rhythm.     Pulses: Intact distal pulses.     Heart sounds: Normal heart sounds. No murmur heard.   Pulmonary:     Effort: Pulmonary effort is normal. No respiratory distress.     Breath sounds: Normal breath sounds. No wheezing or rales.     Comments: No wheezing Musculoskeletal:        General: No edema. Normal range of motion.     Cervical back: Normal range of motion and neck supple.  Lymphadenopathy:     Cervical: No cervical adenopathy.  Skin:    General: Skin is warm and dry.     Findings: No erythema or rash.  Neurological:     Mental Status: He is alert and oriented to person, place, and time.  Psychiatric:        Mood and Affect: Mood and affect normal.        Behavior: Behavior normal.     Comments: Well groomed, good eye contact, normal speech and thoughts     Results for orders placed or performed in visit on 06/28/19  TSH  Result Value Ref Range   TSH 3.34 0.40 - 4.50 mIU/L  Lipid panel  Result Value Ref Range   Cholesterol 167 <200 mg/dL   HDL 36 (L) > OR = 40 mg/dL   Triglycerides 89 <751 mg/dL   LDL Cholesterol (Calc) 112 (H) mg/dL (calc)   Total CHOL/HDL Ratio 4.6 <5.0 (calc)   Non-HDL Cholesterol (Calc) 131 (H) <130 mg/dL (calc)  COMPLETE METABOLIC PANEL WITH GFR  Result Value Ref Range   Glucose, Bld 109 (H) 65 - 99 mg/dL   BUN 16 7 - 25 mg/dL   Creat 7.00 1.74 - 9.44 mg/dL   GFR, Est Non African American 119 > OR = 60 mL/min/1.85m2   GFR, Est African American 138 > OR = 60 mL/min/1.66m2   BUN/Creatinine Ratio NOT APPLICABLE 6 - 22 (calc)   Sodium 140 135 - 146 mmol/L   Potassium 4.0 3.5 - 5.3 mmol/L   Chloride 105 98 - 110 mmol/L   CO2 24 20 - 32 mmol/L   Calcium 8.5 (L) 8.6 - 10.3 mg/dL   Total Protein 6.1 6.1 - 8.1 g/dL   Albumin 4.1 3.6 - 5.1 g/dL   Globulin 2.0 1.9 - 3.7 g/dL (calc)   AG Ratio 2.1 1.0 - 2.5 (calc)   Total Bilirubin 0.4 0.2 - 1.2 mg/dL   Alkaline phosphatase (APISO) 109 36 - 130 U/L   AST 17 10 - 40 U/L   ALT 30 9 - 46 U/L  CBC with Differential/Platelet  Result Value Ref Range   WBC 8.6 3.8 - 10.8 Thousand/uL   RBC 5.21 4.20 - 5.80 Million/uL   Hemoglobin 15.1 13.2 - 17.1 g/dL   HCT 96.7 59.1 - 63.8 %   MCV 85.0 80.0 - 100.0 fL   MCH 29.0 27.0 - 33.0 pg   MCHC 34.1 32.0 - 36.0 g/dL   RDW 46.6 59.9 - 35.7 %   Platelets 212 140 - 400 Thousand/uL   MPV 10.4 7.5 - 12.5 fL   Neutro Abs 6,588 1,500 - 7,800 cells/uL   Lymphs Abs 998 850 - 3,900 cells/uL   Absolute Monocytes 808 200 - 950 cells/uL   Eosinophils Absolute 163 15 - 500 cells/uL   Basophils  Absolute 43 0 - 200 cells/uL   Neutrophils Relative % 76.6 %   Total Lymphocyte 11.6 %   Monocytes Relative 9.4 %   Eosinophils Relative 1.9 %   Basophils Relative 0.5 %  Hemoglobin A1c  Result Value Ref Range   Hgb A1c MFr Bld 5.5 <5.7 %  of total Hgb   Mean Plasma Glucose 111 (calc)   eAG (mmol/L) 6.2 (calc)      Assessment & Plan:   Problem List Items Addressed This Visit    Morbid obesity with BMI of 45.0-49.9, adult (HCC)   Moderate persistent asthma without complication    Persistent chronic problem Seems overall better than in past Will re order Singulair 10mg  nightly goodrx Use albuterol PRN Offered Advair but higher cost If he gets ins coverage we can refer to [facility] for PFTs and further management      Relevant Medications   montelukast (SINGULAIR) 10 MG tablet    Other Visit Diagnoses    Bilateral lower extremity edema    -  Primary   Relevant Medications   furosemide (LASIX) 20 MG tablet   Other Relevant Orders   BASIC METABOLIC PANEL WITH GFR      #LE Edema Subacute on chronic problem Likely some paresthesia pinched nerve with swelling at times, seems very positional No sign of DVT. We'lls Score 0 low risk No edema on exam today.  Plan - Trial on low dose diuretic furosemide 20mg  daily PRN 1-3 days only then stop and may repeat again, limit use to 1-2 time a month max, notify if using too often - RICE therapy per AVS, ace wrap, elevation ice - Improve hydration - Goal wt loss Check BMET within 1 week  #Morbid obesity  BMI >48 Encourage lifestyle modification    Meds ordered this encounter  Medications  . furosemide (LASIX) 20 MG tablet    Sig: Take 1 tablet (20 mg total) by mouth daily as needed. For 1-3 days at a time, then can hold and use again as needed.    Dispense:  30 tablet    Refill:  2  . montelukast (SINGULAIR) 10 MG tablet    Sig: Take 1 tablet (10 mg total) by mouth at bedtime.    Dispense:  90 tablet    Refill:  3      Follow up plan: Return if symptoms worsen or fail to improve.  Future BMET ordered to evaluate chemistry given swelling. Last done 2020. He can return later date for lab only self pay when ready gave him printed order.  ,  DO [facility] 09/01/2020, 11:41 AM

## 2020-09-01 NOTE — Assessment & Plan Note (Signed)
Persistent chronic problem Seems overall better than in past Will re order Singulair 10mg  nightly goodrx Use albuterol PRN Offered Advair but higher cost If he gets ins coverage we can refer to [facility] for PFTs and further management

## 2020-10-16 ENCOUNTER — Telehealth: Payer: Self-pay

## 2020-10-16 NOTE — Telephone Encounter (Signed)
Copied from CRM [id number]. Topic: General - Other >> Oct 16, 2020  2:51 PM Mcneil, Ja-Kwan wrote: Reason for CRM: Pt stated he either misplaced his medications or they were stolen from work so he needs Rx refill approval for furosemide (LASIX) 20 MG tablet and montelukast (SINGULAIR) 10 MG tablet to be sent to [id number] - [place] ([address]), [place] - [address]

## 2020-10-16 NOTE — Telephone Encounter (Signed)
Called his [facility] pharmacy, he has refills available on both rx. They can fill them now, and notify patient.  Saralyn Pilar, DO [facility] 10/16/2020, 5:23 PM

## 2022-04-13 ENCOUNTER — Emergency Department
Admission: EM | Admit: 2022-04-13 | Discharge: 2022-04-13 | Disposition: A | Discharge status: Home / Self Care | Payer: Self-pay | Attending: Emergency Medicine | Admitting: Emergency Medicine

## 2022-04-13 ENCOUNTER — Other Ambulatory Visit: Payer: Self-pay

## 2022-04-13 ENCOUNTER — Encounter: Payer: Self-pay | Admitting: Intensive Care

## 2022-04-13 ENCOUNTER — Emergency Department: Payer: BLUE CROSS/BLUE SHIELD

## 2022-04-13 DIAGNOSIS — S61411A Laceration without foreign body of right hand, initial encounter: Secondary | ICD-10-CM | POA: Insufficient documentation

## 2022-04-13 DIAGNOSIS — Z23 Encounter for immunization: Secondary | ICD-10-CM | POA: Insufficient documentation

## 2022-04-13 DIAGNOSIS — W540XXA Bitten by dog, initial encounter: Secondary | ICD-10-CM | POA: Insufficient documentation

## 2022-04-13 MED ORDER — LIDOCAINE HCL 1 % IJ SOLN
10.0000 mL | Freq: Once | INTRAMUSCULAR | Status: AC
  Administered 2022-04-13: 10 mL
  Filled 2022-04-13: qty 10

## 2022-04-13 MED ORDER — AMOXICILLIN-POT CLAVULANATE 875-125 MG PO TABS: 1.0000 | ORAL_TABLET | Freq: Two times a day (BID) | ORAL | 0 refills | Status: AC

## 2022-04-13 MED ORDER — TETANUS-DIPHTH-ACELL PERTUSSIS 5-2.5-18.5 LF-MCG/0.5 IM SUSY
0.5000 mL | PREFILLED_SYRINGE | Freq: Once | INTRAMUSCULAR | Status: AC
  Administered 2022-04-13: 0.5 mL via INTRAMUSCULAR
  Filled 2022-04-13: qty 0.5

## 2022-04-13 NOTE — ED Provider Notes (Signed)
[facility] Provider Note  Patient Contact: 7:57 PM (approximate)   History   Animal Bite   HPI  Richard Greer is a 36 y.o. male presents to the emergency department with a 5 cm linear laceration from a dog bite wound along the thenar eminence of the left hand.  Patient was seen and evaluated at urgent care earlier in the day and was referred to the emergency department for further care and management.  Dog is up-to-date on rabies vaccination series.      Physical Exam   Triage Vital Signs: ED Triage Vitals  Enc Vitals Group     BP 04/13/22 1757 (!) 160/104     Pulse Rate 04/13/22 1757 81     Resp 04/13/22 1757 18     Temp 04/13/22 1757 98.6 F (37 C)     Temp Source 04/13/22 1757 Oral     SpO2 04/13/22 1757 99 %     Weight 04/13/22 1751 300 lb (136.1 kg)     Height 04/13/22 1751 5\' 10"  (1.778 m)     Head Circumference --      Peak Flow --      Pain Score 04/13/22 1751 6     Pain Loc --      Pain Edu? --      Excl. in [place]? --     Most recent vital signs: Vitals:   04/13/22 1757  BP: (!) 160/104  Pulse: 81  Resp: 18  Temp: 98.6 F (37 C)  SpO2: 99%     General: Alert and in no acute distress. Eyes:  PERRL. EOMI. Head: No acute traumatic findings ENT:      Nose: No congestion/rhinnorhea.      Mouth/Throat: Mucous membranes are moist. Neck: No stridor. No cervical spine tenderness to palpation. Cardiovascular:  Good peripheral perfusion Respiratory: Normal respiratory effort without tachypnea or retractions. Lungs CTAB. Good air entry to the bases with no decreased or absent breath sounds. Gastrointestinal: Bowel sounds 4 quadrants. Soft and nontender to palpation. No guarding or rigidity. No palpable masses. No distention. No CVA tenderness. Musculoskeletal: Patient has 5 cm dog bite wound along thenar eminence of left hand.  Laceration is deep to underlying muscle. Neurologic:  No gross focal neurologic deficits are  appreciated.     ED Results / Procedures / Treatments   Labs (all labs ordered are listed, but only abnormal results are displayed) Labs Reviewed - No data to display      RADIOLOGY  I personally viewed and evaluated these images as part of my medical decision making, as well as reviewing the written report by the radiologist.  ED Provider Interpretation: No acute bony abnormality.   PROCEDURES:  Critical Care performed: No  ..Laceration Repair  Date/Time: 04/13/2022 9:11 PM  Performed by: Leisa Lenz, [place], [place] Authorized by: Lannie Fields, PA-C   Consent:    Consent obtained:  Verbal   Risks discussed:  Infection and pain Universal protocol:    Procedure explained and questions answered to patient or proxy's satisfaction: yes     Patient identity confirmed:  Verbally with patient Anesthesia:    Anesthesia method:  Local infiltration   Local anesthetic:  Lidocaine 1% w/o epi Laceration details:    Location:  Hand   Hand location:  R palm   Length (cm):  5   Depth (mm):  5 Pre-procedure details:    Preparation:  Patient was prepped and draped in usual sterile fashion  Exploration:    Limited defect created (wound extended): yes     Imaging obtained: x-ray     Wound exploration: wound explored through full range of motion     Contaminated: yes   Treatment:    Area cleansed with:  Povidone-iodine   Amount of cleaning:  Extensive   Irrigation solution:  Sterile saline   Irrigation volume:  500   Debridement:  Moderate   Undermining:  None Skin repair:    Repair method:  Sutures   Suture size:  4-0   Suture technique:  Simple interrupted   Number of sutures:  5 Approximation:    Approximation:  Close Repair type:    Repair type:  Simple Post-procedure details:    Dressing:  Open (no dressing)    MEDICATIONS ORDERED IN ED: Medications  Tdap (BOOSTRIX) injection 0.5 mL (0.5 mLs Intramuscular Given 04/13/22 2006)  lidocaine (XYLOCAINE) 1 %  (with pres) injection 10 mL (10 mLs Infiltration Given by Other 04/13/22 2009)     IMPRESSION / MDM / [place] / ED COURSE  I reviewed the triage vital signs and the nursing notes.                              Assessment and plan:  Dog bite wound 36 year old male presents to the emergency department with a dog bite wound along the left hand repaired by PA student [facility].   Patient was hypertensive at triage but vital signs were otherwise reassuring.  X-ray of the left hand shows no retained teeth or acute fractures.  Wound was copiously irrigated in the emergency department and 5 sutures were used to loosely reapproximate dog bite/laceration.  Patient was cautioned that there is a high risk for infection with dog bite wounds.  He was started on Augmentin and the importance of compliance Augmentin was emphasized.  Patient's tetanus status was updated in the emergency department and patient was advised to report incident to animal control.  Recommended that patient return to the emergency department if he notices any redness or streaking surrounding the wound site.  All patient questions were answered.     FINAL CLINICAL IMPRESSION(S) / ED DIAGNOSES   Final diagnoses:  Dog bite, initial encounter     Rx / DC Orders   ED Discharge Orders          Ordered    amoxicillin-clavulanate (AUGMENTIN) 875-125 MG tablet  2 times daily        04/13/22 2108             Note:  This document was prepared using Dragon voice recognition software and may include unintentional dictation errors.   Vallarie Mare [place], Hershal Coria 04/13/22 2116    Delman Kitten, MD 04/13/22 [phone]

## 2022-04-13 NOTE — ED Notes (Signed)
E-signature pad unavailable - Pt verbalized understanding of D/C information - no additional concerns at this time.  

## 2022-04-13 NOTE — ED Provider Triage Note (Signed)
Emergency Medicine Provider Triage Evaluation Note  Richard Greer , a 36 y.o. male  was evaluated in triage.  Pt complains of dog bite to left hand, is patient's dog.  Immunization utd.  Review of Systems  Positive: Dog bite Negative: fever  Physical Exam  BP (!) 160/104 (BP Location: Right Arm)   Pulse 81   Temp 98.6 F (37 C) (Oral)   Resp 18   Ht 5\' 10"  (1.778 m)   Wt 136.1 kg   SpO2 99%   BMI 43.05 kg/m  Gen:   Awake, no distress   Resp:  Normal effort  MSK:   Left hand wrapped in bandage,   Other:    Medical Decision Making  Medically screening exam initiated at 6:00 PM.  Appropriate orders placed.  Richard Greer was informed that the remainder of the evaluation will be completed by another provider, this initial triage assessment does not replace that evaluation, and the importance of remaining in the ED until their evaluation is complete.  Xray tdap ordered   Versie Starks, PA-C 04/13/22 1801

## 2022-04-13 NOTE — ED Notes (Signed)
Provider at bedside for suture repair.

## 2022-04-13 NOTE — ED Triage Notes (Signed)
Patient presents with left hand dog bite. Sent to ER by UC for imaging and sutures

## 2022-04-13 NOTE — Discharge Instructions (Signed)
Have sutures removed in 7 days. Take Augmentin twice daily for 7 days. If you notice any redness or expression of purulence from wound site, please return to the emergency department for reevaluation.

## 2024-04-09 ENCOUNTER — Encounter: Payer: Self-pay | Admitting: Family Medicine

## 2024-04-09 ENCOUNTER — Ambulatory Visit (INDEPENDENT_AMBULATORY_CARE_PROVIDER_SITE_OTHER): Payer: Self-pay | Admitting: Family Medicine

## 2024-04-09 VITALS — BP 138/86 | HR 77 | Ht 70.0 in | Wt 374.0 lb

## 2024-04-09 DIAGNOSIS — R7309 Other abnormal glucose: Secondary | ICD-10-CM

## 2024-04-09 DIAGNOSIS — R030 Elevated blood-pressure reading, without diagnosis of hypertension: Secondary | ICD-10-CM

## 2024-04-09 DIAGNOSIS — Z6841 Body Mass Index (BMI) 40.0 and over, adult: Secondary | ICD-10-CM

## 2024-04-09 DIAGNOSIS — Z7689 Persons encountering health services in other specified circumstances: Secondary | ICD-10-CM

## 2024-04-09 DIAGNOSIS — E7800 Pure hypercholesterolemia, unspecified: Secondary | ICD-10-CM

## 2024-04-09 DIAGNOSIS — J4530 Mild persistent asthma, uncomplicated: Secondary | ICD-10-CM

## 2024-04-09 DIAGNOSIS — J4540 Moderate persistent asthma, uncomplicated: Secondary | ICD-10-CM

## 2024-04-09 MED ORDER — MONTELUKAST SODIUM 10 MG PO TABS: 10.0000 mg | ORAL_TABLET | Freq: Every day | ORAL | 3 refills | Status: AC

## 2024-04-09 MED ORDER — ALBUTEROL SULFATE HFA 108 (90 BASE) MCG/ACT IN AERS: 2.0000 | INHALATION_SPRAY | RESPIRATORY_TRACT | 5 refills | Status: AC | PRN

## 2024-04-09 NOTE — Patient Instructions (Addendum)
 Thank you for coming to the office today.  Use Albuterol  rescue inhaler as needed for asthma symptoms  Singulair  10mg  nightly at bedtime for allergy asthma prevention  If not strong enough let me know we can consider Advair or similar inhaler for daily use  BP elevated but improved on re-check Consider BP cuff at home, Omron brand is good Or can check at pharmacy location if available.  No BP medication today. Goal to manage this naturally with lifestyle, exercise, low sodium, weight loss.  Labs today  Please schedule a Follow-up Appointment to: Return in about 6 months (around 10/07/2024) for 6 month follow-up BP, Asthma, Updates.  If you have any other questions or concerns, please feel free to call the office or send a message through MyChart. You may also schedule an earlier appointment if necessary.  Additionally, you may be receiving a survey about your experience at our office within a few days to 1 week by e-mail or mail. We value your feedback.  Marsa Officer, DO [facility], [place]

## 2024-04-09 NOTE — Progress Notes (Signed)
 Subjective:    Patient ID: Richard Greer, male    DOB: [birth year], 38 y.o.   MRN: [id number]  Richard Greer is a 38 y.o. male presenting on 04/09/2024 for Establish Care  Last visit 2022, re-establishing care now  HPI  Discussed the use of AI scribe software for clinical note transcription with the patient, who gave verbal consent to proceed.  History of Present Illness   Richard Greer is a 38 year old male who presents for a general checkup and management of asthma symptoms.  Mild Persistent Asthma symptoms - Exacerbation of asthma symptoms since September, attributed to high pollen levels - Symptoms worsen with outdoor work - Previously used rescue albuterol  inhaler and Singulair  for asthma and allergies; unable to recall effectiveness of Singulair  - No recent use of a daily inhaler; has tried Advair in the past  Peripheral edema - Mild ankle edema after prolonged standing - Edema resolves with rest - Previously used a diuretic for this symptom. No longer on furosemide .  Elevated blood pressure w/o HTN - Elevated blood pressure noted during an ER visit in 2023 for an injury - No home blood pressure monitoring - No regular exercise regimen, but attempting to increase physical activity - Not on medication  Fatigue and sleep disturbance - Occasional sleep disturbances with multiple nighttime awakenings - Aware of possible sleep apnea but has not undergone formal evaluation or sleep study due to cost  Physical activity - Works outdoors, which contributes to asthma exacerbations during high pollen seasons - Denies regular exercise but is attempting to increase activity       Health Maintenance: Declines vaccines today     04/09/2024   10:28 PM 07/03/2019    9:38 AM 09/12/2018    9:49 AM  Depression screen PHQ 2/9  Decreased Interest 0 0 0  Down, Depressed, Hopeless 0 0 0  PHQ - 2 Score 0 0 0        No data to display           Past Medical History:   Diagnosis Date   Asthma    well-controlled   GERD (gastroesophageal reflux disease)    h/o   Past Surgical History:  Procedure Laterality Date   NO PAST SURGERIES     REPAIR EXTENSOR TENDON Right 09/15/2016   Procedure: REPAIR EXTENSOR TENDON RIGHT INDEX FINGER;  Surgeon: Ozell Flake, MD;  Location: [facility] ORS;  Service: Orthopedics;  Laterality: Right;   Social History   Socioeconomic History   Marital status: Single    Spouse name: Not on file   Number of children: Not on file   Years of education: High School   Highest education level: High school graduate  Occupational History   Occupation: Automotive engineer    Comment: [employer]  Tobacco Use   Smoking status: Former    Current packs/day: 0.00    Average packs/day: 0.2 packs/day for 2.0 years (0.3 ttl pk-yrs)    Types: Cigarettes, E-cigarettes    Start date: 12/17/2015    Quit date: 12/16/2017    Years since quitting: 6.3   Smokeless tobacco: Former   Tobacco comments:    quit 9 months  Vaping Use   Vaping status: Former   Start date: 08/18/2016   Substances: Nicotine, Flavoring  Substance and Sexual Activity   Alcohol use: Yes    Comment: occ   Drug use: No   Sexual activity: Not on file  Other Topics Concern   Not on  file  Social History Narrative   Not on file   Social Drivers of Health   Financial Resource Strain: Not on file  Food Insecurity: Not on file  Transportation Needs: Not on file  Physical Activity: Not on file  Stress: Not on file  Social Connections: Not on file  Intimate Partner Violence: Not on file   Family History  Problem Relation Age of Onset   Stroke Father    Diabetes Father 65   Cancer Maternal Uncle        pancreatic   Cancer Maternal Grandmother        esophageal   Cancer Maternal Grandfather        pancreatic   Heart disease Paternal Grandfather    Colon cancer Neg Hx    Prostate cancer Neg Hx    No current outpatient medications on file prior to visit.   No current  facility-administered medications on file prior to visit.    Review of Systems Per HPI unless specifically indicated above     Objective:    BP 138/86 (BP Location: Left Arm, Cuff Size: Large)   Pulse 77   Ht 5' 10 (1.778 m)   Wt (!) 374 lb (169.6 kg)   SpO2 94%   BMI 53.66 kg/m   Wt Readings from Last 3 Encounters:  04/09/24 (!) 374 lb (169.6 kg)  04/13/22 300 lb (136.1 kg)  09/01/20 (!) 340 lb 8 oz (154.4 kg)    Physical Exam Vitals and nursing note reviewed.  Constitutional:      General: He is not in acute distress.    Appearance: He is well-developed. He is obese. He is not diaphoretic.     Comments: Well-appearing, comfortable, cooperative  HENT:     Head: Normocephalic and atraumatic.  Eyes:     General:        Right eye: No discharge.        Left eye: No discharge.     Conjunctiva/sclera: Conjunctivae normal.  Neck:     Thyroid: No thyromegaly.  Cardiovascular:     Rate and Rhythm: Normal rate and regular rhythm.     Pulses: Normal pulses.     Heart sounds: Normal heart sounds. No murmur heard. Pulmonary:     Effort: Pulmonary effort is normal. No respiratory distress.     Breath sounds: Normal breath sounds. No wheezing or rales.  Musculoskeletal:        General: Normal range of motion.     Cervical back: Normal range of motion and neck supple.     Right lower leg: Edema (trace) present.     Left lower leg: Edema present.  Lymphadenopathy:     Cervical: No cervical adenopathy.  Skin:    General: Skin is warm and dry.     Findings: No erythema or rash.  Neurological:     Mental Status: He is alert and oriented to person, place, and time. Mental status is at baseline.  Psychiatric:        Behavior: Behavior normal.     Comments: Well groomed, good eye contact, normal speech and thoughts     Results for orders placed or performed in visit on 06/28/19  TSH   Collection Time: 06/28/19  8:06 AM  Result Value Ref Range   TSH 3.34 0.40 - 4.50 mIU/L   Lipid panel   Collection Time: 06/28/19  8:06 AM  Result Value Ref Range   Cholesterol 167 <200 mg/dL   HDL 36 (L) >  OR = 40 mg/dL   Triglycerides 89 <849 mg/dL   LDL Cholesterol (Calc) 112 (H) mg/dL (calc)   Total CHOL/HDL Ratio 4.6 <5.0 (calc)   Non-HDL Cholesterol (Calc) 131 (H) <130 mg/dL (calc)  COMPLETE METABOLIC PANEL WITH GFR   Collection Time: 06/28/19  8:06 AM  Result Value Ref Range   Glucose, Bld 109 (H) 65 - 99 mg/dL   BUN 16 7 - 25 mg/dL   Creat 9.21 9.39 - 8.64 mg/dL   GFR, Est Non African American 119 > OR = 60 mL/min/1.28m2   GFR, Est African American 138 > OR = 60 mL/min/1.16m2   BUN/Creatinine Ratio NOT APPLICABLE 6 - 22 (calc)   Sodium 140 135 - 146 mmol/L   Potassium 4.0 3.5 - 5.3 mmol/L   Chloride 105 98 - 110 mmol/L   CO2 24 20 - 32 mmol/L   Calcium 8.5 (L) 8.6 - 10.3 mg/dL   Total Protein 6.1 6.1 - 8.1 g/dL   Albumin 4.1 3.6 - 5.1 g/dL   Globulin 2.0 1.9 - 3.7 g/dL (calc)   AG Ratio 2.1 1.0 - 2.5 (calc)   Total Bilirubin 0.4 0.2 - 1.2 mg/dL   Alkaline phosphatase (APISO) 109 36 - 130 U/L   AST 17 10 - 40 U/L   ALT 30 9 - 46 U/L  CBC with Differential/Platelet   Collection Time: 06/28/19  8:06 AM  Result Value Ref Range   WBC 8.6 3.8 - 10.8 Thousand/uL   RBC 5.21 4.20 - 5.80 Million/uL   Hemoglobin 15.1 13.2 - 17.1 g/dL   HCT 55.6 61.4 - 49.9 %   MCV 85.0 80.0 - 100.0 fL   MCH 29.0 27.0 - 33.0 pg   MCHC 34.1 32.0 - 36.0 g/dL   RDW 86.8 88.9 - 84.9 %   Platelets 212 140 - 400 Thousand/uL   MPV 10.4 7.5 - 12.5 fL   Neutro Abs 6,588 1,500 - 7,800 cells/uL   Lymphs Abs 998 850 - 3,900 cells/uL   Absolute Monocytes 808 200 - 950 cells/uL   Eosinophils Absolute 163 15 - 500 cells/uL   Basophils Absolute 43 0 - 200 cells/uL   Neutrophils Relative % 76.6 %   Total Lymphocyte 11.6 %   Monocytes Relative 9.4 %   Eosinophils Relative 1.9 %   Basophils Relative 0.5 %  Hemoglobin A1c   Collection Time: 06/28/19  8:06 AM  Result Value Ref Range    Hgb A1c MFr Bld 5.5 <5.7 % of total Hgb   Mean Plasma Glucose 111 (calc)   eAG (mmol/L) 6.2 (calc)      Assessment & Plan:   Problem List Items Addressed This Visit     Hypercholesterolemia   Relevant Orders   Lipid panel   Comprehensive metabolic panel with GFR   Moderate persistent asthma without complication - Primary   Relevant Medications   montelukast  (SINGULAIR ) 10 MG tablet   albuterol  (VENTOLIN  HFA) 108 (90 Base) MCG/ACT inhaler   Morbid obesity with BMI of 50.0-59.9, adult (HCC)   Other Visit Diagnoses       Encounter to establish care with new doctor         Mild persistent asthma without complication       Relevant Medications   montelukast  (SINGULAIR ) 10 MG tablet   albuterol  (VENTOLIN  HFA) 108 (90 Base) MCG/ACT inhaler     Elevated hemoglobin A1c       Relevant Orders   Hemoglobin A1c     Elevated BP  without diagnosis of hypertension           Re-establish care Updated Health Maintenance information Due for labs Encouraged improvement to lifestyle with diet and exercise Goal of weight loss  Mild Persistent Asthma and Allergic Rhinitis Intermittent asthma exacerbations and allergic rhinitis triggered by pollen. Singulair  discussed for dual asthma and allergy control. Considered cost-effectiveness versus daily inhalers. - Prescribe albuterol  rescue inhaler as needed. - Prescribe Singulair  (montelukast ) 90-day supply nightly. - Advise monitoring symptoms; consider daily inhaler if Singulair  insufficient.  Elevated Blood Pressure Elevated Blood pressure 142/90 recently, borderline elevated. No prior diagnosis Discussed lifestyle modifications; no immediate antihypertensive needed. - Advise exercise and low sodium diet. - Recommend home blood pressure monitoring (Omron suggested) or pharmacy machines. - No antihypertensive medication prescribed.  Morbid Obesity BMI >53 Obesity contributing to elevated blood pressure and potential sleep apnea. Emphasized  weight management through lifestyle changes. - Encourage regular exercise and weight management. - Discuss DASH diet.  Peripheral Edema of Lower Extremities Mild edema after standing, resolves with rest. No diuretic therapy needed. - Monitor symptoms; no diuretic therapy needed.  Risk for Obstructive Sleep Apnea Discussed risk due to night waking episodes. No formal diagnosis or sleep study yet. - Consider future sleep study evaluation, especially with insurance coverage.         Orders Placed This Encounter  Procedures   Hemoglobin A1c   Lipid panel    Has the patient fasted?:   Yes   Comprehensive metabolic panel with GFR    Has the patient fasted?:   Yes    Meds ordered this encounter  Medications   montelukast  (SINGULAIR ) 10 MG tablet    Sig: Take 1 tablet (10 mg total) by mouth at bedtime.    Dispense:  90 tablet    Refill:  3   albuterol  (VENTOLIN  HFA) 108 (90 Base) MCG/ACT inhaler    Sig: Inhale 2 puffs into the lungs every 4 (four) hours as needed for wheezing or shortness of breath (cough).    Dispense:  1 each    Refill:  5     Follow up plan: Return in about 6 months (around 10/07/2024) for 6 month follow-up BP, Asthma, Updates.  Marsa Officer, DO [facility] 04/09/2024, 8:35 AM

## 2024-04-10 ENCOUNTER — Ambulatory Visit: Payer: Self-pay | Admitting: Family Medicine

## 2024-04-10 LAB — COMPREHENSIVE METABOLIC PANEL WITH GFR
AG Ratio: 2.3 (calc) (ref 1.0–2.5)
ALT: 32 U/L (ref 9–46)
AST: 19 U/L (ref 10–40)
Albumin: 4.4 g/dL (ref 3.6–5.1)
Alkaline phosphatase (APISO): 100 U/L (ref 36–130)
BUN: 14 mg/dL (ref 7–25)
CO2: 30 mmol/L (ref 20–32)
Calcium: 8.7 mg/dL (ref 8.6–10.3)
Chloride: 103 mmol/L (ref 98–110)
Creat: 0.72 mg/dL (ref 0.60–1.26)
Globulin: 1.9 g/dL (ref 1.9–3.7)
Glucose, Bld: 102 mg/dL — ABNORMAL HIGH (ref 65–99)
Potassium: 4.5 mmol/L (ref 3.5–5.3)
Sodium: 139 mmol/L (ref 135–146)
Total Bilirubin: 0.5 mg/dL (ref 0.2–1.2)
Total Protein: 6.3 g/dL (ref 6.1–8.1)
eGFR: 121 mL/min/1.73m2 (ref >=60)

## 2024-04-10 LAB — HEMOGLOBIN A1C
Hgb A1c MFr Bld: 5.9 % — ABNORMAL HIGH (ref <5.7)
Mean Plasma Glucose: 123 mg/dL
eAG (mmol/L): 6.8 mmol/L

## 2024-04-10 LAB — LIPID PANEL
Cholesterol: 171 mg/dL (ref <200)
HDL: 37 mg/dL — ABNORMAL LOW (ref >=40)
LDL Cholesterol (Calc): 109 mg/dL — ABNORMAL HIGH
Non-HDL Cholesterol (Calc): 134 mg/dL — ABNORMAL HIGH (ref <130)
Total CHOL/HDL Ratio: 4.6 (calc) (ref <5.0)
Triglycerides: 142 mg/dL (ref <150)

## 2024-10-07 ENCOUNTER — Ambulatory Visit: Payer: Self-pay | Admitting: Family Medicine
